# Patient Record
Sex: Female | Born: 1985 | Race: White | Hispanic: No | Marital: Married | Smoking: Current some day smoker
Health system: Southern US, Community
[De-identification: ages and names within clinical notes are randomized; demographics above are authoritative.]

## PROBLEM LIST (undated history)

## (undated) ENCOUNTER — Ambulatory Visit: Discharge status: Absent without leave | Payer: Self-pay

## (undated) DIAGNOSIS — K859 Acute pancreatitis without necrosis or infection, unspecified: Secondary | ICD-10-CM

## (undated) HISTORY — PX: PLACEMENT OF BREAST IMPLANTS: SHX6334

## (undated) HISTORY — DX: Acute pancreatitis without necrosis or infection, unspecified: K85.90

## (undated) HISTORY — PX: TUMOR REMOVAL: SHX12

---
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
## 2007-09-13 ENCOUNTER — Emergency Department (HOSPITAL_COMMUNITY): Admission: EM | Admit: 2007-09-13 | Discharge: 2007-09-14 | Payer: Self-pay | Admitting: Emergency Medicine

## 2007-11-06 ENCOUNTER — Encounter (INDEPENDENT_AMBULATORY_CARE_PROVIDER_SITE_OTHER): Payer: Self-pay | Admitting: Obstetrics and Gynecology

## 2007-11-06 ENCOUNTER — Inpatient Hospital Stay (HOSPITAL_COMMUNITY): Admission: AD | Admit: 2007-11-06 | Discharge: 2007-11-06 | Payer: Self-pay | Admitting: Obstetrics and Gynecology

## 2008-05-15 ENCOUNTER — Inpatient Hospital Stay (HOSPITAL_COMMUNITY): Admission: AD | Admit: 2008-05-15 | Discharge: 2008-05-15 | Payer: Self-pay | Admitting: Obstetrics and Gynecology

## 2008-08-26 ENCOUNTER — Emergency Department (HOSPITAL_COMMUNITY): Admission: EM | Admit: 2008-08-26 | Discharge: 2008-08-27 | Payer: Self-pay | Admitting: Emergency Medicine

## 2009-04-09 ENCOUNTER — Emergency Department: Payer: Self-pay | Admitting: Emergency Medicine

## 2009-05-10 ENCOUNTER — Inpatient Hospital Stay (HOSPITAL_COMMUNITY): Admission: AD | Admit: 2009-05-10 | Discharge: 2009-05-10 | Payer: Self-pay | Admitting: Obstetrics and Gynecology

## 2010-09-16 LAB — WET PREP, GENITAL
Clue Cells Wet Prep HPF POC: NONE SEEN
Yeast Wet Prep HPF POC: NONE SEEN

## 2010-09-16 LAB — ABO/RH: ABO/RH(D): O POS

## 2010-09-16 LAB — CBC
HCT: 32.9 % — ABNORMAL LOW (ref 36.0–46.0)
Platelets: 250 10*3/uL (ref 150–400)
WBC: 10.8 10*3/uL — ABNORMAL HIGH (ref 4.0–10.5)

## 2010-09-16 LAB — HCG, QUANTITATIVE, PREGNANCY: hCG, Beta Chain, Quant, S: 4118 m[IU]/mL — ABNORMAL HIGH (ref <5)

## 2010-10-27 NOTE — Discharge Summary (Signed)
NAMEORENE, ABBASI             ACCOUNT NO.:  [id number]   MEDICAL RECORD NO.:  [id number]          PATIENT TYPE:  INP   LOCATION:  9142                          FACILITY:  [facility]   PHYSICIAN:  Sherron Monday, MD        DATE OF BIRTH:  [birth year]   DATE OF ADMISSION:  11/25/2006  DATE OF DISCHARGE:  11/27/2006                               DISCHARGE SUMMARY   ADMITTING DIAGNOSES:  1. Intrauterine pregnancy at term.  2. Early labor.  3. High blood pressure.   DISCHARGE DIAGNOSES:  1. Intrauterine pregnancy at term.  2. Early labor.  3. High blood pressure.  4. Delivered via spontaneous vaginal delivery.   HPI:  A 25 year old G1 P1 with an EDC of November 25, 2006.  Was admitted on  the 13th with early labor and elevated blood pressures on the day she  was supposed to be admitted for induction of labor.  She has good fetal movement, no loss of fluid, no vaginal bleeding and  painful contractions.   PRENATAL LABS:  She is O positive, antibody screen negative and RPR  nonreactive, rubella equivocal.  Hepatitis B Surface Antigen negative,  HIV negative, gonorrhea and Chlamydia negative.  One hour Glucola of 83.  Group B Strep was negative, quad screen was negative.   An ultrasound performed on May 17, 2006, at approximately 12 weeks  and 4 days gave an [facility] of November 25, 2006.  A repeat ultrasound on July 04, 2006, at approximately 19 weeks revealed normal anatomy.  Prenatal  care was essentially uncomplicated except for some pre-term contractions  with a negative fetal fibronectin and recent borderline blood pressure  elevation.  On the day of admission she started contracting at midnight  and on admission denies any symptoms of preeclampsia such as headache,  blurry vision, scotomata or swelling.  Once she was admitted and  comfortable her blood pressures normalized, these were just monitored  closely.   PAST MEDICAL HISTORY:  Nonsignificant.   PAST SURGICAL HISTORY:   Significant for ear surgery in 2001.   PAST OB/GYN HISTORY:  1. She is a G1 with the present pregnancy.  2. No abnormal Pap smears or sexually transmitted diseases.   MEDICATIONS:  None.   ALLERGIES:  No known drug allergies.   SOCIAL HISTORY:  Denies alcohol, tobacco or drug use.   FAMILY HISTORY:  Significant for:  1. Father with high blood pressure.  2. Mother with thyroid disease.  3. Maternal grandmother with skin cancer.   On admission she was afebrile, vital signs stable.  An elevated blood  pressure, it was 142-146/100-106.  Shortly thereafter her blood pressure  was closely monitored and had noted to settle into the 140's/90's and  just had to be monitored closely.  An artificial rupture of membranes  was performed.  She was found to be 3-4 cm dilated, 90% effaced and 0  station, AROMs were clear fluid.  Her labor course was relatively  uncomplicated.  She progressed to complete, complete +2.  Pushed well to  deliver a viable female infant at 8 with the placenta  delivered at  1536.  The weight if the infant was 8 pounds, 7 ounces and Apgars were 8  and 9.  There was a right labial laceration, a secondary perineal  laceration which were repaired with #3-0 Vicryl.   EBL:  Less than 500 mL.   Following the delivery, the risks, benefits and alternatives of the  circumcision was discussed with the patient and father of the baby, they  wished to proceed.  Her postpartum course was uncomplicated, she was  afebrile, stable vital signs, normal lochia and her pain was well  controlled.  She was discharged to home on postpartum day #2 doing well.  She was discharged with prescriptions for Motrin, Vicodin and prenatal  vitamins as well as routine instructions and numbers to call with any  questions or problems.  She will follow up in approximately 6 weeks.  Her blood type is O positive.  She is rubella equivocal and received a  rubella immunization prior to discharge.  She would  like to discuss her  contraceptive options at 6 weeks.  Her hemoglobin is 10.2 and she plans  to breastfeed.      Sherron Monday, MD  Electronically Signed     JB/MEDQ  D:  11/27/2006  T:  11/27/2006  Job:  [id number]

## 2010-10-30 NOTE — Op Note (Signed)
[place]  Patient:    Donna Ryan, Donna Ryan                    MRN: [id number] Proc. Date: 10/09/99 Adm. Date:  [id number] Attending:  Serena Colonel H CC:         Linward Headland, M.D.                           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  PROCEDURE:  Left tympanoplasty/mastoidectomy with total ossicular replacement prosthesis.  SURGEON:  Jefry H. Pollyann Kennedy, M.D.  ANESTHESIA:  General endotracheal anesthesia.  COMPLICATIONS:  None.  ESTIMATED BLOOD LOSS:  40 cc.  REFERRING PHYSICIAN:  Linward Headland, M.D.  HISTORY:  A 25 year old girl with a multi-year history of chronic drainage from the left ear and significant conductive hearing loss.  Risks, benefits, alternatives and complications of the procedure were explained to the mother, who seemed to understand and agreed to surgery.  PROCEDURE:  The patient was taken to the operating room, placed on the operating table in the supine position.  Following induction of general endotracheal anesthesia, the left ear was prepped and draped in the standard fashion.  Xylocaine 1% with epinephrine was infiltrated into four quadrants of the ear canal and the postauricular sulcus.  Inspection of the ear canal was performed using the operating microscope and cerumen was cleaned from the canal.  A deep posterior retraction was identified.  I was not able to see the depth of it posteriorly.  There was retraction within the attic as well.  The malleus was identified, but the other ossicles were not visualized through the canal.  A vascular strip was created posteriorly and postauricular skin incision was created bringing the ear forward.  Loose areolar tissue graft  was harvested from the temporalis area, pressed and dried on the back table.  A tympanomeatal flap was brought anteriorly off the posterior canal wall. Cholesteatoma matrix was invading posteriorly beyond the facial recess area and attempts were made to remove all of this abnormal tissue.  I was not able to perform this safely and a mastoidectomy was then performed.  Mastoid cortex was removed with a large cutting bur.  The mastoid air cells were somewhat sclerotic and mal-developed and filled with inflamed mucosa, but no cholesteatoma.  The tegmen was identified and smoothed down with a diamond bur.  The sigmoid sinus plate was identified as well and also smoothed down with diamond bur and kept intact.  The sigmoid was riding somewhat anteriorly. The lateral semicircular canal was identified and also smoothed.  The antrum was entered. 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  The facial nerve external genu was identified and was intact. There was no dehiscence in this area.  There did not appear by visual inspection or palpation to be any dehiscent facial nerves in the vertical segment either.  The ossicles were identified as the tympanic membrane was brought forward visualizing through the mastoid and through the ear canal. The inferior most aspect of the incus long process was absent.  The stapes superstructure was absent.  The footplate was intact and there was cholesteatoma in the round and oval window areas that was easily cleaned out without disrupting the mucosa  covering the oval window or the round window. Cholesteatoma was teased out of the epitympanum and the antrum area.  The head of the malleus was resected with malleus nippers.  The remnant of the incus was removed as well.  All cholesteatoma was cleaned out surrounding these areas and sent for pathologic evaluation.  There was some extending down into the hypotympanum that was cleaned out as  well.  The only area that remained difficult to visualize by this root was the facial recess area.  An incision was then made to take down the posterior canal wall.  This was taken down to the vertical segment of the facial nerve, which was all intact.  Cholesteatoma was then cleaned out of the facial recess area adequately.  Once all cholesteatoma matrix was resected adequately and the entire cavity was polished with a polishing diamond bur, the anterior middle ear was packed with saline-soaked Gelfoam.  A graft was cut to size and shape and a notch was left for the malleus and it was then inserted medial to the anterior tympanic membrane remnant to reconstruct the tympanic membrane.  The posterior aspect was draped over the facial nerve.  The posterior tympanic cavity was packed with saline-soaked Gelfoam and a Torp prosthesis was cut down to proper size and was placed connecting the lateral process of the malleus down to the footplate.  There was good positioning.  There was good mobility of the prosthesis with the malleus.  The round window reflex was present.  The ear canal and cavity were packed with Cortisporin soaked Gelfoam.  A meatoplasty was performed with sharp dissection through the superior and inferior incisurae.  The conchal bowl skin was tacked posteriorly using a Vicryl suture.  Postauricular incision was reapproximated with subcutaneous Vicryl suture.  Benzoin and Steri-Strips were applied in the postauricular sulcus. The ear canal was packed with bacitracin-coated cotton ball and a mastoid dressing was applied.  Patient was then awakened, extubated and transferred to recovery in stable condition. DD:  10/09/99 TD:  10/10/99 Job: [id number]

## 2011-03-10 LAB — CBC
MCV: 91.6
RBC: 3.99
WBC: 6.4

## 2011-03-10 LAB — GC/CHLAMYDIA PROBE AMP, GENITAL
Chlamydia, DNA Probe: NEGATIVE
GC Probe Amp, Genital: NEGATIVE

## 2011-03-10 LAB — SAMPLE TO BLOOD BANK

## 2011-03-10 LAB — HCG, QUANTITATIVE, PREGNANCY: hCG, Beta Chain, Quant, S: 48 — ABNORMAL HIGH

## 2011-03-19 LAB — CBC
Platelets: 274 10*3/uL (ref 150–400)
RDW: 13.2 % (ref 11.5–15.5)
WBC: 6.8 10*3/uL (ref 4.0–10.5)

## 2011-03-19 LAB — WET PREP, GENITAL
Trich, Wet Prep: NONE SEEN
Yeast Wet Prep HPF POC: NONE SEEN

## 2011-03-19 LAB — GC/CHLAMYDIA PROBE AMP, GENITAL: Chlamydia, DNA Probe: NEGATIVE

## 2011-03-19 LAB — HCG, QUANTITATIVE, PREGNANCY: hCG, Beta Chain, Quant, S: 29880 m[IU]/mL — ABNORMAL HIGH (ref <5)

## 2011-04-01 LAB — CBC
HCT: 29.8 — ABNORMAL LOW
Hemoglobin: 10.2 — ABNORMAL LOW
Hemoglobin: 9.7 — ABNORMAL LOW
MCHC: 34.3
MCV: 91.3
Platelets: 300
RBC: 3.05 — ABNORMAL LOW
RBC: 3.12 — ABNORMAL LOW
RBC: 3.26 — ABNORMAL LOW
RDW: 14
WBC: 11.6 — ABNORMAL HIGH
WBC: 13.1 — ABNORMAL HIGH

## 2011-04-01 LAB — URINALYSIS, ROUTINE W REFLEX MICROSCOPIC
Bilirubin Urine: NEGATIVE
Glucose, UA: NEGATIVE
Glucose, UA: NEGATIVE
Hgb urine dipstick: NEGATIVE
Leukocytes, UA: NEGATIVE
Protein, ur: NEGATIVE
Specific Gravity, Urine: 1.025
Specific Gravity, Urine: >1.03 — ABNORMAL HIGH
Urobilinogen, UA: 0.2
pH: 5.5

## 2011-04-01 LAB — COMPREHENSIVE METABOLIC PANEL
ALT: 11
AST: 16
AST: 24
Alkaline Phosphatase: 179 — ABNORMAL HIGH
BUN: 6
CO2: 20
CO2: 24
Calcium: 9.4
Chloride: 109
Creatinine, Ser: 0.57
GFR calc Af Amer: >60
GFR calc Af Amer: >60
GFR calc non Af Amer: >60
Glucose, Bld: 86
Glucose, Bld: 96
Potassium: 4
Sodium: 136
Total Bilirubin: 0.6
Total Protein: 5.8 — ABNORMAL LOW

## 2011-04-01 LAB — URINE MICROSCOPIC-ADD ON: WBC, UA: NONE SEEN

## 2011-04-01 LAB — LACTATE DEHYDROGENASE
LDH: 167
LDH: 178

## 2014-09-17 ENCOUNTER — Emergency Department (HOSPITAL_COMMUNITY)
Admission: EM | Admit: 2014-09-17 | Discharge: 2014-09-17 | Disposition: A | Discharge status: Home / Self Care | Payer: 59 | Attending: Emergency Medicine | Admitting: Emergency Medicine

## 2014-09-17 ENCOUNTER — Encounter (HOSPITAL_COMMUNITY): Payer: Self-pay

## 2014-09-17 DIAGNOSIS — N644 Mastodynia: Secondary | ICD-10-CM | POA: Diagnosis present

## 2014-09-17 DIAGNOSIS — Z79899 Other long term (current) drug therapy: Secondary | ICD-10-CM | POA: Diagnosis not present

## 2014-09-17 DIAGNOSIS — Y831 Surgical operation with implant of artificial internal device as the cause of abnormal reaction of the patient, or of later complication, without mention of misadventure at the time of the procedure: Secondary | ICD-10-CM | POA: Diagnosis not present

## 2014-09-17 DIAGNOSIS — T8549XA Other mechanical complication of breast prosthesis and implant, initial encounter: Secondary | ICD-10-CM | POA: Diagnosis not present

## 2014-09-17 DIAGNOSIS — Z3202 Encounter for pregnancy test, result negative: Secondary | ICD-10-CM | POA: Diagnosis not present

## 2014-09-17 DIAGNOSIS — Z720 Tobacco use: Secondary | ICD-10-CM | POA: Diagnosis not present

## 2014-09-17 LAB — POC URINE PREG, ED
Preg Test, Ur: NEGATIVE
Preg Test, Ur: NEGATIVE

## 2014-09-17 NOTE — ED Provider Notes (Signed)
CSN: [id number]     Arrival date & time 09/17/14  1931 History   First MD Initiated Contact with Patient 09/17/14 2039     Chief Complaint  Patient presents with  . Breast Problem     (Consider location/radiation/quality/duration/timing/severity/associated sxs/prior Treatment) HPI   29 year old female presenting for evaluation of left breast pain. Patient is status post breast augmentation years ago. 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 She had acute onset of some pain behind her left breast. Describes his as pressure and numbness. She feels like her left implant is raised as compared to the right and has ripples along the lateral aspect of the left breast.  History reviewed. No pertinent past medical history. Past Surgical History  Procedure Laterality Date  . Placement of breast implants     History reviewed. No pertinent family history. History  Substance Use Topics  . Smoking status: Light Tobacco Smoker  . Smokeless tobacco: Not on file  . Alcohol Use: Yes   OB History    No data available     Review of Systems  All systems reviewed and negative, other than as noted in HPI.   Allergies  Review of patient's allergies indicates no known allergies.  Home Medications   Prior to Admission medications   Medication Sig Start Date End Date Taking? Authorizing Provider  amphetamine-dextroamphetamine (ADDERALL XR) 30 MG 24 hr capsule Take 30 mg by mouth daily.   Yes Historical Provider, MD   BP 123/84 mmHg  Pulse 60  Temp(Src) 98.2 F (36.8 C) (Oral)  Resp 12  Wt 125 lb 9.6 oz (56.972 kg)  SpO2 100%  LMP 09/17/2014 Physical Exam  Constitutional: She appears well-developed and well-nourished. No distress.  HENT:  Head: Normocephalic and atraumatic.  Eyes: Conjunctivae are normal. Right eye exhibits no discharge. Left eye exhibits no discharge.  Neck: Neck supple.  Cardiovascular: Normal rate, regular rhythm and normal heart  sounds.  Exam reveals no gallop and no friction rub.   No murmur heard. Pulmonary/Chest: Effort normal and breath sounds normal. No respiratory distress.  Left breast is of slightly raised appearance as compared to the right. There is some rippling of the skin along the lateral aspect of the left breast presumably from underlying implant.   Abdominal: Soft. She exhibits no distension. There is no tenderness.  Musculoskeletal: She exhibits no edema or tenderness.  Neurological: She is alert.  Skin: Skin is warm and dry.  Psychiatric: She has a normal mood and affect. Her behavior is normal. Thought content normal.  Nursing note and vitals reviewed.   ED Course  Procedures (including critical care time) Labs Review Labs Reviewed  POC URINE PREG, ED    Imaging Review No results found.   EKG Interpretation None      MDM   Final diagnoses:  Rippling of breast implant, initial encounter    29yF with L breast pain/deformity of L breast implant. Rupture versus movement. Will US. 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   Discussed with radiology. [facility]ending [facility] to [facility]. ED/inpatient studies done primarily for abscess. Radiology feels that both for technical and financial reasons she would be better served going there so they do so many more of them.     Raeford RazorStephen Rindi Beechy, MD 09/25/14 [phone]

## 2014-09-17 NOTE — Discharge Instructions (Signed)
Radiology feels that you would be better served in going to the [facility] for imaging. The ultrasound technicians there image breasts routinely. The technicians in the inpatient setting do not do them frequently and when they do it's typically looking for things such as abscess.

## 2014-09-17 NOTE — ED Notes (Signed)
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

## 2015-08-04 DIAGNOSIS — Z3009 Encounter for other general counseling and advice on contraception: Secondary | ICD-10-CM | POA: Diagnosis not present

## 2015-08-04 DIAGNOSIS — Z30013 Encounter for initial prescription of injectable contraceptive: Secondary | ICD-10-CM | POA: Diagnosis not present

## 2015-12-25 ENCOUNTER — Emergency Department (HOSPITAL_COMMUNITY): Payer: 59

## 2015-12-25 ENCOUNTER — Emergency Department (HOSPITAL_COMMUNITY)
Admission: EM | Admit: 2015-12-25 | Discharge: 2015-12-25 | Disposition: A | Discharge status: Home / Self Care | Payer: 59 | Attending: Emergency Medicine | Admitting: Emergency Medicine

## 2015-12-25 ENCOUNTER — Encounter (HOSPITAL_COMMUNITY): Payer: Self-pay | Admitting: Emergency Medicine

## 2015-12-25 DIAGNOSIS — Y999 Unspecified external cause status: Secondary | ICD-10-CM | POA: Diagnosis not present

## 2015-12-25 DIAGNOSIS — Y939 Activity, unspecified: Secondary | ICD-10-CM | POA: Diagnosis not present

## 2015-12-25 DIAGNOSIS — S39012A Strain of muscle, fascia and tendon of lower back, initial encounter: Secondary | ICD-10-CM | POA: Insufficient documentation

## 2015-12-25 DIAGNOSIS — S29019A Strain of muscle and tendon of unspecified wall of thorax, initial encounter: Secondary | ICD-10-CM

## 2015-12-25 DIAGNOSIS — R0789 Other chest pain: Secondary | ICD-10-CM | POA: Diagnosis not present

## 2015-12-25 DIAGNOSIS — Y92410 Unspecified street and highway as the place of occurrence of the external cause: Secondary | ICD-10-CM | POA: Insufficient documentation

## 2015-12-25 DIAGNOSIS — F17200 Nicotine dependence, unspecified, uncomplicated: Secondary | ICD-10-CM | POA: Diagnosis not present

## 2015-12-25 DIAGNOSIS — S3992XA Unspecified injury of lower back, initial encounter: Secondary | ICD-10-CM | POA: Diagnosis present

## 2015-12-25 DIAGNOSIS — S8002XA Contusion of left knee, initial encounter: Secondary | ICD-10-CM | POA: Diagnosis not present

## 2015-12-25 DIAGNOSIS — S29012A Strain of muscle and tendon of back wall of thorax, initial encounter: Secondary | ICD-10-CM | POA: Insufficient documentation

## 2015-12-25 MED ORDER — TRAMADOL HCL 50 MG PO TABS: 50.0000 mg | ORAL_TABLET | Freq: Four times a day (QID) | ORAL | Status: DC | PRN

## 2015-12-25 MED ORDER — IBUPROFEN 800 MG PO TABS: 800.0000 mg | ORAL_TABLET | Freq: Three times a day (TID) | ORAL | Status: DC | PRN
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
## 2015-12-25 NOTE — ED Notes (Signed)
Declined W/C at D/C and was escorted to lobby by RN. 

## 2015-12-25 NOTE — Discharge Instructions (Signed)
Return here as needed. Follow up with your primary doctor. Ice and heat on the areas that are sore.
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
## 2019-02-14 ENCOUNTER — Encounter (HOSPITAL_COMMUNITY): Payer: Self-pay | Admitting: Emergency Medicine

## 2019-02-14 ENCOUNTER — Emergency Department (HOSPITAL_COMMUNITY)
Admission: EM | Admit: 2019-02-14 | Discharge: 2019-02-14 | Discharge status: Against Medical Advice | Payer: 59 | Attending: Emergency Medicine | Admitting: Emergency Medicine

## 2019-02-14 DIAGNOSIS — F10920 Alcohol use, unspecified with intoxication, uncomplicated: Secondary | ICD-10-CM | POA: Insufficient documentation

## 2019-02-14 DIAGNOSIS — Z79899 Other long term (current) drug therapy: Secondary | ICD-10-CM | POA: Insufficient documentation

## 2019-02-14 DIAGNOSIS — F32A Depression, unspecified: Secondary | ICD-10-CM

## 2019-02-14 DIAGNOSIS — F329 Major depressive disorder, single episode, unspecified: Secondary | ICD-10-CM | POA: Insufficient documentation

## 2019-02-14 DIAGNOSIS — Z046 Encounter for general psychiatric examination, requested by authority: Secondary | ICD-10-CM | POA: Insufficient documentation

## 2019-02-14 DIAGNOSIS — F17200 Nicotine dependence, unspecified, uncomplicated: Secondary | ICD-10-CM | POA: Insufficient documentation

## 2019-02-14 DIAGNOSIS — R45851 Suicidal ideations: Secondary | ICD-10-CM | POA: Insufficient documentation

## 2019-02-14 DIAGNOSIS — Z5329 Procedure and treatment not carried out because of patient's decision for other reasons: Secondary | ICD-10-CM | POA: Insufficient documentation

## 2019-02-14 DIAGNOSIS — F419 Anxiety disorder, unspecified: Secondary | ICD-10-CM | POA: Insufficient documentation

## 2019-02-14 DIAGNOSIS — Y906 Blood alcohol level of 120-199 mg/100 ml: Secondary | ICD-10-CM | POA: Insufficient documentation

## 2019-02-14 LAB — COMPREHENSIVE METABOLIC PANEL
ALT: 14 U/L (ref 0–44)
AST: 16 U/L (ref 15–41)
Albumin: 4.5 g/dL (ref 3.5–5.0)
Alkaline Phosphatase: 75 U/L (ref 38–126)
Anion gap: 9 (ref 5–15)
BUN: 14 mg/dL (ref 6–20)
CO2: 23 mmol/L (ref 22–32)
Calcium: 9 mg/dL (ref 8.9–10.3)
Chloride: 108 mmol/L (ref 98–111)
Creatinine, Ser: 0.69 mg/dL (ref 0.44–1.00)
GFR calc Af Amer: >60 mL/min (ref >60)
GFR calc non Af Amer: >60 mL/min (ref >60)
Glucose, Bld: 101 mg/dL — ABNORMAL HIGH (ref 70–99)
Potassium: 3.3 mmol/L — ABNORMAL LOW (ref 3.5–5.1)
Sodium: 140 mmol/L (ref 135–145)
Total Bilirubin: 0.3 mg/dL (ref 0.3–1.2)
Total Protein: 7.8 g/dL (ref 6.5–8.1)

## 2019-02-14 LAB — RAPID URINE DRUG SCREEN, HOSP PERFORMED
Amphetamines: NOT DETECTED
Barbiturates: NOT DETECTED
Benzodiazepines: NOT DETECTED
Cocaine: NOT DETECTED
Opiates: NOT DETECTED
Tetrahydrocannabinol: NOT DETECTED

## 2019-02-14 LAB — SALICYLATE LEVEL: Salicylate Lvl: <7 mg/dL (ref 2.8–30.0)

## 2019-02-14 LAB — I-STAT BETA HCG BLOOD, ED (MC, WL, AP ONLY): I-stat hCG, quantitative: <5 m[IU]/mL (ref <5)

## 2019-02-14 LAB — CBC
HCT: 41.8 % (ref 36.0–46.0)
Hemoglobin: 14 g/dL (ref 12.0–15.0)
MCH: 32.2 pg (ref 26.0–34.0)
MCHC: 33.5 g/dL (ref 30.0–36.0)
MCV: 96.1 fL (ref 80.0–100.0)
Platelets: 287 10*3/uL (ref 150–400)
RBC: 4.35 MIL/uL (ref 3.87–5.11)
RDW: 11.9 % (ref 11.5–15.5)
WBC: 6.5 10*3/uL (ref 4.0–10.5)
nRBC: 0 % (ref 0.0–0.2)

## 2019-02-14 LAB — ACETAMINOPHEN LEVEL: Acetaminophen (Tylenol), Serum: <10 ug/mL — ABNORMAL LOW (ref 10–30)

## 2019-02-14 LAB — ETHANOL: Alcohol, Ethyl (B): 185 mg/dL — ABNORMAL HIGH (ref <10)

## 2019-02-14 NOTE — ED Provider Notes (Signed)
[facility] DEPT Provider Note   CSN: [id number] Arrival date & time: 02/14/19  0014     History   Chief Complaint No chief complaint on file.   HPI Donna Ryan is a 33 y.o. female.     33 year old female presents to the emergency department for suicidal ideations.  Patient was experiencing suicidal ideations earlier in the evening, without plan.  Is now stating that she just "wants to die".  She states that "sometimes life just sucks", but will not allude to any specific stressors contributing to her worsening depression.  She has never been seen in the past by a therapist or psychiatrist.  No history of behavioral health hospitalization.  She does own/have access to a firearm, but states that she would "never use it for that".  Had 2 glasses of wine tonight.  No reported illicit drug use.  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  The history is provided by the patient. No language interpreter was used.    History reviewed. No pertinent past medical history.  There are no active problems to display for this patient.   Past Surgical History:  Procedure Laterality Date  . PLACEMENT OF BREAST IMPLANTS       OB History   No obstetric history on file.      Home Medications    Prior to Admission medications   Medication Sig Start Date End Date Taking? Authorizing Provider  amphetamine-dextroamphetamine (ADDERALL XR) 30 MG 24 hr capsule Take 30 mg by mouth daily.    [provider]  ibuprofen (ADVIL,MOTRIN) 800 MG tablet Take 1 tablet (800 mg total) by mouth every 8 (eight) hours as needed. 12/25/15   Lawyer, Harrell Gave, PA-C  traMADol (ULTRAM) 50 MG tablet Take 1 tablet (50 mg total) by mouth every 6 (six) hours as needed for severe pain. 12/25/15   Dalia Heading, PA-C    Family History No family history on file.  Social History Social History   Tobacco Use  . Smoking status: Light Tobacco Smoker  . Smokeless tobacco:  Never Used  Substance Use Topics  . Alcohol use: Yes  . Drug use: No     Allergies   Patient has no known allergies.   Review of Systems Review of Systems Ten systems reviewed and are negative for acute change, except as noted in the HPI.    Physical Exam Updated Vital Signs BP (!) 165/148 (BP Location: Right Arm)   Pulse (!) 123 Comment: pt crying while vitals being obtained  Temp 98.8 F (37.1 C) (Oral)   Resp (!) 22   SpO2 100%   Physical Exam Vitals signs and nursing note reviewed.  Constitutional:      General: She is not in acute distress.    Appearance: She is well-developed. She is not diaphoretic.     Comments: Patient tearful, anxious.  HENT:     Head: Normocephalic and atraumatic.  Eyes:     General: No scleral icterus.    Conjunctiva/sclera: Conjunctivae normal.  Neck:     Musculoskeletal: Normal range of motion.  Pulmonary:     Effort: Pulmonary effort is normal. No respiratory distress.     Comments: Respirations even and unlabored Musculoskeletal: Normal range of motion.  Skin:    General: Skin is warm and dry.     Coloration: Skin is not pale.     Findings: No erythema or rash.  Neurological:     Mental Status: She is alert and oriented  to person, place, and time.     Coordination: Coordination normal.  Psychiatric:        Mood and Affect: Mood is anxious and depressed. Affect is tearful.        Speech: Speech normal.        Behavior: Behavior is cooperative.        Thought Content: Thought content does not include homicidal or suicidal ideation. Thought content does not include homicidal plan.     Comments: States she was having SI earlier, but not currently. Now is feeling that she "just doesn't want to be alive".      ED Treatments / Results  Labs (all labs ordered are listed, but only abnormal results are displayed) Labs Reviewed  COMPREHENSIVE METABOLIC PANEL - Abnormal; Notable for the following components:      Result Value    Potassium 3.3 (*)    Glucose, Bld 101 (*)    All other components within normal limits  ETHANOL - Abnormal; Notable for the following components:   Alcohol, Ethyl (B) 185 (*)    All other components within normal limits  ACETAMINOPHEN LEVEL - Abnormal; Notable for the following components:   Acetaminophen (Tylenol), Serum <10 (*)    All other components within normal limits  SALICYLATE LEVEL  CBC  RAPID URINE DRUG SCREEN, HOSP PERFORMED  I-STAT BETA HCG BLOOD, ED ([facility], [facility], [facility] ONLY)    EKG None  Radiology No results found.  Procedures Procedures (including critical care time)  Medications Ordered in ED Medications - No data to display   Initial Impression / Assessment and Plan / ED Course  I have reviewed the triage vital signs and the nursing notes.  Pertinent labs & imaging results that were available during my care of the patient were reviewed by me and considered in my medical decision making (see chart for details).        33 year old female presents to the emergency department for worsening depression and suicidal thoughts.  She states that she was experiencing SI prior to arrival, but currently denies SI or plan.  She does own/have access to a firearm, but reports that she would never use it to kill herself.  She has no history of behavioral health hospitalization.  The patient has been medically cleared and is pending TTS assessment for recommendations.  Disposition to be determined by oncoming ED provider.   Final Clinical Impressions(s) / ED Diagnoses   Final diagnoses:  Depression, unspecified depression type  Alcoholic intoxication without complication [facility](HCC)    ED Discharge Orders    None       Antony MaduraHumes, Jasani Dolney, PA-C 02/14/19 0537    Dione BoozeGlick, David, MD 02/14/19 [phone]

## 2019-02-14 NOTE — ED Triage Notes (Signed)
Patient here from home with boyfriend with complaints of SI stating "I just want to die". "I hate my life". Patient does not elaborate.

## 2019-02-14 NOTE — ED Notes (Signed)
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 Wants to do outpatient. MD notified.

## 2019-02-14 NOTE — BHH Counselor (Addendum)
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  Spoke with Pt's mother Teressa Lower at [phone] who said that she believed Pt was safe to be discharged.  Consulted with Peri Maris, DO.  Pt may leave AMA.

## 2019-02-14 NOTE — ED Notes (Signed)
Sent page via [place] to [facility] on call provider regarding patient's request to d/c to community with outpatient referral.

## 2019-02-14 NOTE — ED Notes (Signed)
Pt provided with clothes and personal belongings, she has resources and willing to sign AMA. Mother is with pt as they leave the department.

## 2019-02-14 NOTE — BH Assessment (Signed)
Tele Assessment Note   Patient Name: Donna Ryan MRN: [id number] Referring Physician: Antonietta Breach, PA-C Location of Patient: [facility] Location of Provider: [place] is a 33 y.o. female who presented to [facility] on voluntary basis (accompanied by boyfriend, who was not present at assessment) with complaint of suicidal ideation and other symptoms.  Pt lives in [place] with her boyfriend and her preteen son.  Pt owns and operates two businesses.  She is not followed by any outpatient psych or therapy provider.  Pt presented with complaint of suicidal ideation -- ''I just want to die... I prefer not to be here (alive)... I wish I were dead.''  Pt denied any plan or intent, and she denied any past suicide attempts.  Pt noted several stressors/triggers -- recent conflict with boyfriend (who may have been unfaithful), difficulty being a single mother raising a son, stress of managing two businesses, conflict with friends.  Pt reported that she has started drinking wine every night, and BAC on admission was 185.  Pt endorsed despondency that has lasted about a year, insomnia, poor appetite, tearfulness, feelings of hopelessness and worthlessness, isolation, fatigue, loss of pleasure.  Pt denied homicidal ideation, hallucination, and self-injurious behavior.  Pt reported having access to firearms, but she told hospital staff that she would never shoot herself with it.  Pt appears to have several criminal charges pending, including misdemeanor assault with a deadly weapon, communicating threats, and assault with a deadly weapon in front of a minor.    When asked what help Pt desired today, Pt stated that she was not sure.  When asked if she would harm herself if discharged, she stated, ''I don't know.'' Pt said she is open to inpatient care if she can find someone to operate her businesses.  During assessment, Pt presented as alert and oriented.  She had good eye contact  and was cooperative.  Pt was dressed in scrubs, and she appeared appropriately groomed.  Pt's demeanor was calm.  Pt's mood was depressed.  Affect was depressed and tearful.  Pt's speech was normal in rate, rhythm, and volume.  Thought processes were within normal range, and thought content was logical and goal-oriented.  There was no evidence of delusion.  Pt's memory and concentration were intact.  Insight, judgment, and impulse control were fair.  Consulted with Berneta Levins, NP, who determined that Pt meets inpatient criteria.  Diagnosis: Major Depressive Disorder, Single Ep., Severe w/o psychotic features  Past Medical History: History reviewed. No pertinent past medical history.  Past Surgical History:  Procedure Laterality Date  . PLACEMENT OF BREAST IMPLANTS      Family History: No family history on file.  Social History:  reports that she has been smoking. She has never used smokeless tobacco. She reports current alcohol use of about 2.0 - 3.0 standard drinks of alcohol per week. She reports that she does not use drugs.  Additional Social History:  Alcohol / Drug Use Pain Medications: See MAR Prescriptions: See MAR Over the Counter: See MAR History of alcohol / drug use?: Yes Substance #1 Name of Substance 1: Alcohol 1 - Amount (size/oz): Varied 1 - Frequency: Daily 1 - Duration: Ongoing 1 - Last Use / Amount: 02/13/2019 -- 2-3 glasses of wine; 185 BAC  CIWA: CIWA-Ar BP: 100/67 Pulse Rate: 70 COWS:    Allergies: No Known Allergies  Home Medications: (Not in a hospital admission)   OB/GYN Status:  No LMP recorded. Patient has  had an injection.  General Assessment Data Location of Assessment: [facility] TTS Assessment: In system Is this a Tele or Face-to-Face Assessment?: Tele Assessment Is this an Initial Assessment or a Re-assessment for this encounter?: Initial Assessment Patient Accompanied by:: N/A Language Other than English: No Living Arrangements: Other  (Comment) What gender do you identify as?: Female Marital status: Long term relationship Maiden name: Hurtado Pregnancy Status: No Living Arrangements: Spouse/significant other, Children(Boyfriend and preteen son) Can pt return to current living arrangement?: Yes Admission Status: Voluntary Is patient capable of signing voluntary admission?: Yes Referral Source: Self/Family/Friend Insurance type: None     Crisis Care Plan Living Arrangements: Spouse/significant other, Children(Boyfriend and preteen son) Name of Psychiatrist: None Name of Therapist: None  Education Status Is patient currently in school?: No Is the patient employed, unemployed or receiving disability?: Employed(Owns two businesses)  Risk to self with the past 6 months Suicidal Ideation: Yes-Currently Present Has patient been a risk to self within the past 6 months prior to admission? : No Suicidal Intent: No Has patient had any suicidal intent within the past 6 months prior to admission? : No Is patient at risk for suicide?: Yes(See notes) Suicidal Plan?: No Has patient had any suicidal plan within the past 6 months prior to admission? : No Access to Means: Yes Specify Access to Suicidal Means: Pt acknowledged access to firearms What has been your use of drugs/alcohol within the last 12 months?: Wine Previous Attempts/Gestures: No Intentional Self Injurious Behavior: None Family Suicide History: Unknown Recent stressful life event(s): Financial Problems Persecutory voices/beliefs?: No Depression: Yes Depression Symptoms: Despondent, Insomnia, Tearfulness, Isolating, Fatigue, Loss of interest in usual pleasures, Feeling worthless/self pity Substance abuse history and/or treatment for substance abuse?: No Suicide prevention information given to non-admitted patients: Not applicable  Risk to Others within the past 6 months Homicidal Ideation: No Does patient have any lifetime risk of violence toward others  beyond the six months prior to admission? : No Thoughts of Harm to Others: No Current Homicidal Intent: No Current Homicidal Plan: No Access to Homicidal Means: No History of harm to others?: No Assessment of Violence: None Noted Does patient have access to weapons?: No Criminal Charges Pending?: Yes Describe Pending Criminal Charges: Mis. assault by pointing a gun; AWDW minor present(Mis communicating threats; Mis injury to personal property) Does patient have a court date: No Is patient on probation?: No  Psychosis Hallucinations: None noted Delusions: None noted  Mental Status Report Appearance/Hygiene: Unremarkable, In scrubs Eye Contact: Good Motor Activity: Freedom of movement, Unremarkable Speech: Logical/coherent Level of Consciousness: Alert, Crying Mood: Depressed Affect: Depressed Anxiety Level: None Thought Processes: Coherent, Relevant Judgement: Partial Orientation: Person, Place, Time, Situation Obsessive Compulsive Thoughts/Behaviors: None  Cognitive Functioning Concentration: Normal Memory: Recent Intact, Remote Intact Is patient IDD: No Insight: Fair Impulse Control: Fair Appetite: Poor Have you had any weight changes? : Loss Amount of the weight change? (lbs): (Not sure) Sleep: Decreased Total Hours of Sleep: (Not sure) Vegetative Symptoms: None  ADLScreening [facility] Assessment Services) Patient's cognitive ability adequate to safely complete daily activities?: Yes Patient able to express need for assistance with ADLs?: Yes Independently performs ADLs?: Yes (appropriate for developmental age)  Prior Inpatient Therapy Prior Inpatient Therapy: No  Prior Outpatient Therapy Prior Outpatient Therapy: No Does patient have an ACCT team?: No Does patient have Intensive In-House Services?  : No Does patient have [facility] services? : No Does patient have P4CC services?: No  ADL Screening (condition at time of admission) Patient's  cognitive ability  adequate to safely complete daily activities?: Yes Is the patient deaf or have difficulty hearing?: No Does the patient have difficulty seeing, even when wearing glasses/contacts?: No Does the patient have difficulty concentrating, remembering, or making decisions?: No Patient able to express need for assistance with ADLs?: Yes Does the patient have difficulty dressing or bathing?: No Independently performs ADLs?: Yes (appropriate for developmental age) Does the patient have difficulty walking or climbing stairs?: No Weakness of Legs: None Weakness of Arms/Hands: None  Home Assistive Devices/Equipment Home Assistive Devices/Equipment: None  Therapy Consults (therapy consults require a physician order) PT Evaluation Needed: No OT Evalulation Needed: No SLP Evaluation Needed: No Abuse/Neglect Assessment (Assessment to be complete while patient is alone) Abuse/Neglect Assessment Can Be Completed: Yes Physical Abuse: Denies Verbal Abuse: Denies Sexual Abuse: Denies Exploitation of patient/patient's resources: Denies Self-Neglect: Denies Values / Beliefs Cultural Requests During Hospitalization: None Spiritual Requests During Hospitalization: None Consults Spiritual Care Consult Needed: No Social Work Consult Needed: No Merchant navy officer (For Healthcare) Does Patient Have a Medical Advance Directive?: No          Disposition:  Disposition Initial Assessment Completed for this Encounter: Yes Disposition of Patient: Admit Type of inpatient treatment program: Adult(Per T. Starkes, NP, Pt meets inpt criteria.)  This service was provided via telemedicine using a 2-way, interactive audio and video technology.  Names of all persons participating in this telemedicine service and their role in this encounter. Name: Maram Zendejas. Kai Role: Pt  Name: T. Starkes Role: NP  Name: E Ellee Wawrzyniak Role: TTS       Earline Mayotte 02/14/2019 9:20 AM

## 2019-02-14 NOTE — ED Notes (Signed)
Pt is requesting discharge home. Donna Ryan and Dr Mariea Clonts have discussed her case, a note has been made that she can sign AMA. They have spoken with her mother and mother feels she is ok for discharge. Pt is aware she will have to sign AMA. Resources for outpatient assistance have been provided. Pt has spent time with mother talking about AMA.

## 2019-02-14 NOTE — ED Notes (Signed)
Patient crying hysterically reporting that "hates life" and the situation that she is in.

## 2020-09-11 ENCOUNTER — Encounter: Payer: Self-pay | Admitting: Family Medicine

## 2020-09-11 ENCOUNTER — Other Ambulatory Visit: Payer: Self-pay

## 2020-09-11 ENCOUNTER — Ambulatory Visit (LOCAL_COMMUNITY_HEALTH_CENTER): Payer: Self-pay | Admitting: Family Medicine

## 2020-09-11 VITALS — BP 126/84 | Ht 64.0 in | Wt 153.6 lb

## 2020-09-11 DIAGNOSIS — B9689 Other specified bacterial agents as the cause of diseases classified elsewhere: Secondary | ICD-10-CM

## 2020-09-11 DIAGNOSIS — N760 Acute vaginitis: Secondary | ICD-10-CM

## 2020-09-11 DIAGNOSIS — Z30013 Encounter for initial prescription of injectable contraceptive: Secondary | ICD-10-CM

## 2020-09-11 DIAGNOSIS — Z0000 Encounter for general adult medical examination without abnormal findings: Secondary | ICD-10-CM

## 2020-09-11 DIAGNOSIS — Z3009 Encounter for other general counseling and advice on contraception: Secondary | ICD-10-CM

## 2020-09-11 LAB — WET PREP FOR TRICH, YEAST, CLUE
Trichomonas Exam: NEGATIVE
Yeast Exam: NEGATIVE

## 2020-09-11 LAB — PREGNANCY, URINE: Preg Test, Ur: NEGATIVE

## 2020-09-11 MED ORDER — METRONIDAZOLE 500 MG PO TABS: 500.0000 mg | ORAL_TABLET | Freq: Two times a day (BID) | ORAL | 0 refills | Status: AC

## 2020-09-11 MED ORDER — MEDROXYPROGESTERONE ACETATE 150 MG/ML IM SUSP
150.0000 mg | INTRAMUSCULAR | Status: AC
  Administered 2020-09-11: 150 mg via INTRAMUSCULAR

## 2020-09-11 NOTE — Progress Notes (Signed)
[facility] [address] Main Number: [phone]    Family Planning Visit- Initial Visit  Subjective:  Donna Ryan is a 35 y.o.  No obstetric history on file.   being seen today for an initial annual visit and to discuss contraceptive options.  The patient is currently using None for pregnancy prevention. Patient reports she does not want a pregnancy in the next year.  Patient has the following medical conditions does not have a problem list on file.  Chief Complaint  Patient presents with  . Annual Exam  . Contraception    Depo    Patient reports here for physical and to start depo.    Patient denies any questions or concerns.    Body mass index is 26.37 kg/m. - Patient is eligible for diabetes screening based on BMI and age >58?  not applicable HA1C ordered? not applicable  Patient reports 2  partner/s in last year. Desires STI screening?  Yes  Has patient been screened once for HCV in the past?  No  No results found for: HCVAB  Does the patient have current drug use (including MJ), have a partner with drug use, and/or has been incarcerated since last result? No  If yes-- Screen for HCV through [facility]   Does the patient meet criteria for HBV testing? No  Criteria:  -Household, sexual or needle sharing contact with HBV -History of drug use -HIV positive -Those with known Hep C   Health Maintenance Due  Topic Date Due  . Hepatitis C Screening  Never done  . COVID-19 Vaccine (1) Never done  . HIV Screening  Never done  . TETANUS/TDAP  Never done  . PAP SMEAR-Modifier  Never done  . INFLUENZA VACCINE  01/13/2020    Review of Systems  Constitutional: Negative for chills, fever, malaise/fatigue and weight loss.  HENT: Negative for congestion, hearing loss and sore throat.   Eyes: Negative for blurred vision, double vision and photophobia.  Respiratory: Negative for shortness of breath.    Cardiovascular: Negative for chest pain.  Gastrointestinal: Negative for abdominal pain, blood in stool, constipation, diarrhea, heartburn, nausea and vomiting.  Genitourinary: Negative for dysuria and frequency.  Musculoskeletal: Negative for back pain, joint pain and neck pain.  Skin: Negative for itching and rash.  Neurological: Negative for dizziness, weakness and headaches.  Endo/Heme/Allergies: Does not bruise/bleed easily.  Psychiatric/Behavioral: Negative for depression, substance abuse and suicidal ideas.    The following portions of the patient's history were reviewed and updated as appropriate: allergies, current medications, past family history, past medical history, past social history, past surgical history and problem list. Problem list updated.   See flowsheet for other program required questions.  Objective:   Vitals:   09/11/20 1326  BP: 126/84  Weight: 153 lb 9.6 oz (69.7 kg)  Height: 5\' 4"  (1.626 m)    Physical Exam Vitals and nursing note reviewed.  Constitutional:      Appearance: Normal appearance.  HENT:     Head: Normocephalic and atraumatic.     Mouth/Throat:     Mouth: Mucous membranes are moist.     Dentition: Normal dentition. No dental caries.     Pharynx: No oropharyngeal exudate or posterior oropharyngeal erythema.  Eyes:     General: No scleral icterus. Cardiovascular:     Rate and Rhythm: Normal rate.     Pulses: Normal pulses.  Pulmonary:     Effort: Pulmonary effort is  normal.  Chest:  Breasts:     Right: Normal.     Left: Normal.      Comments: Breasts:        Right: Normal. No swelling, mass, nipple discharge, skin change or tenderness.  Implant felt        Left: Normal. No swelling, mass, nipple discharge, skin change or tenderness. Implant felt  Abdominal:     General: Abdomen is flat. Bowel sounds are normal.     Palpations: Abdomen is soft.  Genitourinary:    General: Normal vulva.     Rectum: Normal.     Comments:  External genitalia without, lice, nits, erythema, edema , lesions or inguinal adenopathy. Vagina with normal mucosa and discharge and pH > 4.  Cervix without visual lesions, uterus firm, mobile, non-tender, no masses, CMT adnexal fullness or tenderness. Odor present.    Musculoskeletal:        General: Normal range of motion.     Cervical back: Normal range of motion and neck supple.  Skin:    General: Skin is warm and dry.  Neurological:     General: No focal deficit present.     Mental Status: She is alert and oriented to person, place, and time.  Psychiatric:        Mood and Affect: Mood normal.        Behavior: Behavior normal.       Assessment and Plan:  Donna Ryan is a 35 y.o. female presenting to the [facility] Department for an initial annual wellness/contraceptive visit   1. Family planning counseling  Contraception counseling: Reviewed all forms of birth control options in the tiered based approach. available including abstinence; over the counter/barrier methods; hormonal contraceptive medication including pill, patch, ring, injection,contraceptive implant, ECP; hormonal and nonhormonal IUDs; permanent sterilization options including vasectomy and the various tubal sterilization modalities. Risks, benefits, and typical effectiveness rates were reviewed.  Questions were answered.  Written information was also given to the patient to review.  Patient desires Depo, this was prescribed for patient. She will follow up in 3 months  for surveillance.  She was told to call with any further questions, or with any concerns about this method of contraception.  Emphasized use of condoms 100% of the time for STI prevention.  Patient was not offered ECP based on patient declined.   2. Routine general medical examination at a health care facility 3. Encounter for initial prescription of injectable contraceptive Patient here for PE and to start Depo.  Patient reports having 2  menses 1 one month.  PT negative today.  Pap not due until 07/2023.  CBE done today.    Pregnancy, urine - WET PREP FOR TRICH, YEAST, CLUE - Chlamydia/Gonorrhea [facility] - medroxyPROGESTERone (DEPO-PROVERA) injection 150 mg  4. BV (bacterial vaginosis) Wet prep has + amine & clue.  Patient treated for BV .  - metroNIDAZOLE (FLAGYL) 500 MG tablet; Take 1 tablet (500 mg total) by mouth 2 (two) times daily for 7 days.  Dispense: 14 tablet; Refill: 0   Return in about 11 weeks (around 11/27/2020) for Depo.  No future appointments.  Wendi Snipes, FNP

## 2020-09-11 NOTE — Progress Notes (Signed)
PT negative. Wet mount reviewed, and patient treated for BV per SO. Depo consent signed and Depo given, right glute, tolerated well and next Depo card given.Burt Knack, RN

## 2020-09-11 NOTE — Progress Notes (Signed)
Patient here for PE and wants to start Depo. Has taken Depo before, but not for several years. Currently using no BCM.  Last Pap and PE were 07/25/2018, and Pap was NIL and HPV negative.Burt Knack, RN

## 2020-11-24 ENCOUNTER — Emergency Department (HOSPITAL_BASED_OUTPATIENT_CLINIC_OR_DEPARTMENT_OTHER): Payer: Self-pay | Admitting: Radiology

## 2020-11-24 ENCOUNTER — Other Ambulatory Visit: Payer: Self-pay

## 2020-11-24 ENCOUNTER — Emergency Department (HOSPITAL_BASED_OUTPATIENT_CLINIC_OR_DEPARTMENT_OTHER)
Admission: EM | Admit: 2020-11-24 | Discharge: 2020-11-24 | Disposition: A | Discharge status: Home / Self Care | Payer: Self-pay | Attending: Emergency Medicine | Admitting: Emergency Medicine

## 2020-11-24 ENCOUNTER — Encounter (HOSPITAL_BASED_OUTPATIENT_CLINIC_OR_DEPARTMENT_OTHER): Payer: Self-pay | Admitting: *Deleted

## 2020-11-24 DIAGNOSIS — R1013 Epigastric pain: Secondary | ICD-10-CM | POA: Insufficient documentation

## 2020-11-24 DIAGNOSIS — K224 Dyskinesia of esophagus: Secondary | ICD-10-CM | POA: Insufficient documentation

## 2020-11-24 DIAGNOSIS — R197 Diarrhea, unspecified: Secondary | ICD-10-CM | POA: Insufficient documentation

## 2020-11-24 LAB — BASIC METABOLIC PANEL
Anion gap: 10 (ref 5–15)
BUN: 15 mg/dL (ref 6–20)
CO2: 26 mmol/L (ref 22–32)
Calcium: 9.6 mg/dL (ref 8.9–10.3)
Chloride: 101 mmol/L (ref 98–111)
Creatinine, Ser: 0.83 mg/dL (ref 0.44–1.00)
GFR, Estimated: >60 mL/min (ref >60)
Glucose, Bld: 96 mg/dL (ref 70–99)
Potassium: 3.6 mmol/L (ref 3.5–5.1)
Sodium: 137 mmol/L (ref 135–145)

## 2020-11-24 LAB — CBC
HCT: 44.8 % (ref 36.0–46.0)
Hemoglobin: 15.2 g/dL — ABNORMAL HIGH (ref 12.0–15.0)
MCH: 32.3 pg (ref 26.0–34.0)
MCHC: 33.9 g/dL (ref 30.0–36.0)
MCV: 95.1 fL (ref 80.0–100.0)
Platelets: 273 10*3/uL (ref 150–400)
RBC: 4.71 MIL/uL (ref 3.87–5.11)
RDW: 12 % (ref 11.5–15.5)
WBC: 3.9 10*3/uL — ABNORMAL LOW (ref 4.0–10.5)
nRBC: 0 % (ref 0.0–0.2)

## 2020-11-24 LAB — TROPONIN I (HIGH SENSITIVITY): Troponin I (High Sensitivity): <2 ng/L (ref <18)

## 2020-11-24 LAB — PREGNANCY, URINE: Preg Test, Ur: NEGATIVE

## 2020-11-24 MED ORDER — ONDANSETRON 4 MG PO TBDP: 4.0000 mg | ORAL_TABLET | ORAL | 0 refills | Status: DC | PRN

## 2020-11-24 MED ORDER — FAMOTIDINE 40 MG PO TABS: 40.0000 mg | ORAL_TABLET | Freq: Two times a day (BID) | ORAL | 0 refills | Status: DC

## 2020-11-24 MED ORDER — FAMOTIDINE 20 MG PO TABS
40.0000 mg | ORAL_TABLET | Freq: Once | ORAL | Status: AC
  Administered 2020-11-24: 40 mg via ORAL
  Filled 2020-11-24: qty 2

## 2020-11-24 NOTE — ED Triage Notes (Signed)
Midsternum chest pain about 11 am.  Then to throat, and rt arm as well as pain in mid back. Sent from [facility]. Reports she had "food poisoning yesterday" with vomiting.

## 2020-11-24 NOTE — ED Provider Notes (Signed)
[facility] EMERGENCY DEPT Provider Note   CSN: [id number] Arrival date & time: 11/24/20  1432     History Chief Complaint  Patient presents with   Chest Pain    Donna Ryan is a 35 y.o. female.  HPI Patient reports that she vomited all night until early hours of morning.  She had eaten a sausage biscuit at [facility] yesterday afternoon.  When she ate it, she did not feel well and then about 2 hours later she started vomiting and vomited all night.  It had stopped by early hours the morning.  She reports she was feeling better then this morning she suddenly got a severe pain to the center of her chest.  She reports it felt like she had a fist in the middle of her chest and then pain radiated up towards the base of her throat and out towards her shoulders and through to her back.  She did not take anything to try to relieve the pain.  It did start to abate and she was able to rest for a while.  She got back up and seem to be better but then the whole thing happened a second time.  Again, the pain has resolved.  She has not gone on to have any further vomiting.  She had a small amount of diarrhea.  No syncope.    History reviewed. No pertinent past medical history.  There are no problems to display for this patient.   Past Surgical History:  Procedure Laterality Date   PLACEMENT OF BREAST IMPLANTS     TUMOR REMOVAL       OB History     Gravida  1   Para  1   Term  1   Preterm      AB      Living  1      SAB      IAB      Ectopic      Multiple      Live Births              No family history on file.  Social History   Tobacco Use   Smoking status: Never   Smokeless tobacco: Never  Substance Use Topics   Alcohol use: Yes    Alcohol/week: 2.0 - 3.0 standard drinks    Types: 2 - 3 Glasses of wine per week    Comment: occasional   Drug use: No    Home Medications Prior to Admission medications   Medication Sig Start Date End Date  Taking? Authorizing Provider  famotidine (PEPCID) 40 MG tablet Take 1 tablet (40 mg total) by mouth 2 (two) times daily. 11/24/20  Yes Arby Barrette, MD  ondansetron (ZOFRAN ODT) 4 MG disintegrating tablet Take 1 tablet (4 mg total) by mouth every 4 (four) hours as needed for nausea or vomiting. 11/24/20  Yes Arby Barrette, MD  ibuprofen (ADVIL,MOTRIN) 800 MG tablet Take 1 tablet (800 mg total) by mouth every 8 (eight) hours as needed. Patient not taking: No sig reported 12/25/15   Lawyer, Cristal Deer, PA-C  traMADol (ULTRAM) 50 MG tablet Take 1 tablet (50 mg total) by mouth every 6 (six) hours as needed for severe pain. Patient not taking: No sig reported 12/25/15   Charlestine Night, PA-C    Allergies    Patient has no known allergies.  Review of Systems   Review of Systems 10 systems reviewed and negative except as per HPI Physical Exam Updated Vital  Signs BP 125/87 (BP Location: Right Arm)   Pulse (!) 59   Temp 98 F (36.7 C) (Oral)   Resp 16   Ht 5\' 2"  (1.575 m)   Wt 67.1 kg   LMP 11/07/2020 (Approximate)   SpO2 99%   BMI 27.07 kg/m   Physical Exam Constitutional:      Appearance: Normal appearance.  Eyes:     Extraocular Movements: Extraocular movements intact.  Cardiovascular:     Rate and Rhythm: Normal rate and regular rhythm.  Pulmonary:     Effort: Pulmonary effort is normal.     Breath sounds: Normal breath sounds.  Chest:     Chest wall: No tenderness.  Abdominal:     Palpations: Abdomen is soft.     Comments: Mild epigastric and left upper quadrant tenderness no guarding.  Musculoskeletal:        General: No swelling or tenderness. Normal range of motion.     Right lower leg: No edema.     Left lower leg: No edema.  Skin:    General: Skin is warm and dry.  Neurological:     General: No focal deficit present.     Mental Status: She is alert and oriented to person, place, and time.     Coordination: Coordination normal.    ED Results / Procedures  / Treatments   Labs (all labs ordered are listed, but only abnormal results are displayed) Labs Reviewed  CBC - Abnormal; Notable for the following components:      Result Value   WBC 3.9 (*)    Hemoglobin 15.2 (*)    All other components within normal limits  BASIC METABOLIC PANEL  PREGNANCY, URINE  TROPONIN I (HIGH SENSITIVITY)    EKG EKG Interpretation  Date/Time:  Monday November 24 2020 14:38:31 EDT Ventricular Rate:  72 PR Interval:  124 QRS Duration: 88 QT Interval:  388 QTC Calculation: 424 R Axis:   77 Text Interpretation: Normal sinus rhythm Anterior infarct , age undetermined Abnormal ECG No old tracing to compare Confirmed by 04[birth year]28[birth year]1988 [phone]) on 11/24/2020 2:42:13 PM  Radiology DG Chest 2 View  Result Date: 11/24/2020 CLINICAL DATA:  Chest pain. EXAM: CHEST - 2 VIEW COMPARISON:  Chest x-ray dated December 25, 2015. FINDINGS: The heart size and mediastinal contours are within normal limits. Both lungs are clear. The visualized skeletal structures are unremarkable. IMPRESSION: No active cardiopulmonary disease. Electronically Signed   By: December 27, 2015 M.D.   On: 11/24/2020 15:03    Procedures Procedures   Medications Ordered in ED Medications  famotidine (PEPCID) tablet 40 mg (40 mg Oral Given 11/24/20 1724)    ED Course  I have reviewed the triage vital signs and the nursing notes.  Pertinent labs & imaging results that were available during my care of the patient were reviewed by me and considered in my medical decision making (see chart for details).    MDM Rules/Calculators/A&P                          Patient presents as outlined above.  At this time findings are very suggestive of esophageal spasm.  Patient is very low risk for cardiac ischemic event.  Vital signs are normal.  Troponin is less than 2.  Patient did have multiple episodes of vomiting overnight.  At this time I have low suspicion for Boerhaave's tear.  Patient's pain has significantly  abated and chest x-ray is clear, no  leukocytosis.  Plan will be a week of Pepcid twice daily and Zofran as needed.  Return precautions included in discharge instructions.  Patient is recommended to follow-up with PCP for recheck. Final Clinical Impression(s) / ED Diagnoses Final diagnoses:  Esophageal spasm    Rx / DC Orders ED Discharge Orders          Ordered    famotidine (PEPCID) 40 MG tablet  2 times daily        11/24/20 1718    ondansetron (ZOFRAN ODT) 4 MG disintegrating tablet  Every 4 hours PRN        11/24/20 1718             Arby Barrette, MD 11/24/20 1740

## 2020-11-24 NOTE — Discharge Instructions (Addendum)
1.  Take Pepcid 40 mg twice daily for a week.  You may take Zofran if needed for nausea. 2.  Schedule a recheck with your family doctor. 3.  Return to the emergency department if you get new, worsening or recurring symptoms.

## 2023-03-28 IMAGING — DX DG CHEST 2V
2 series · 2 of 2 positions shown · non-contrast
Comparison: Chest x-ray dated December 25, 2015.

CLINICAL DATA: Chest pain.

EXAM:
CHEST - 2 VIEW

[chest pa]
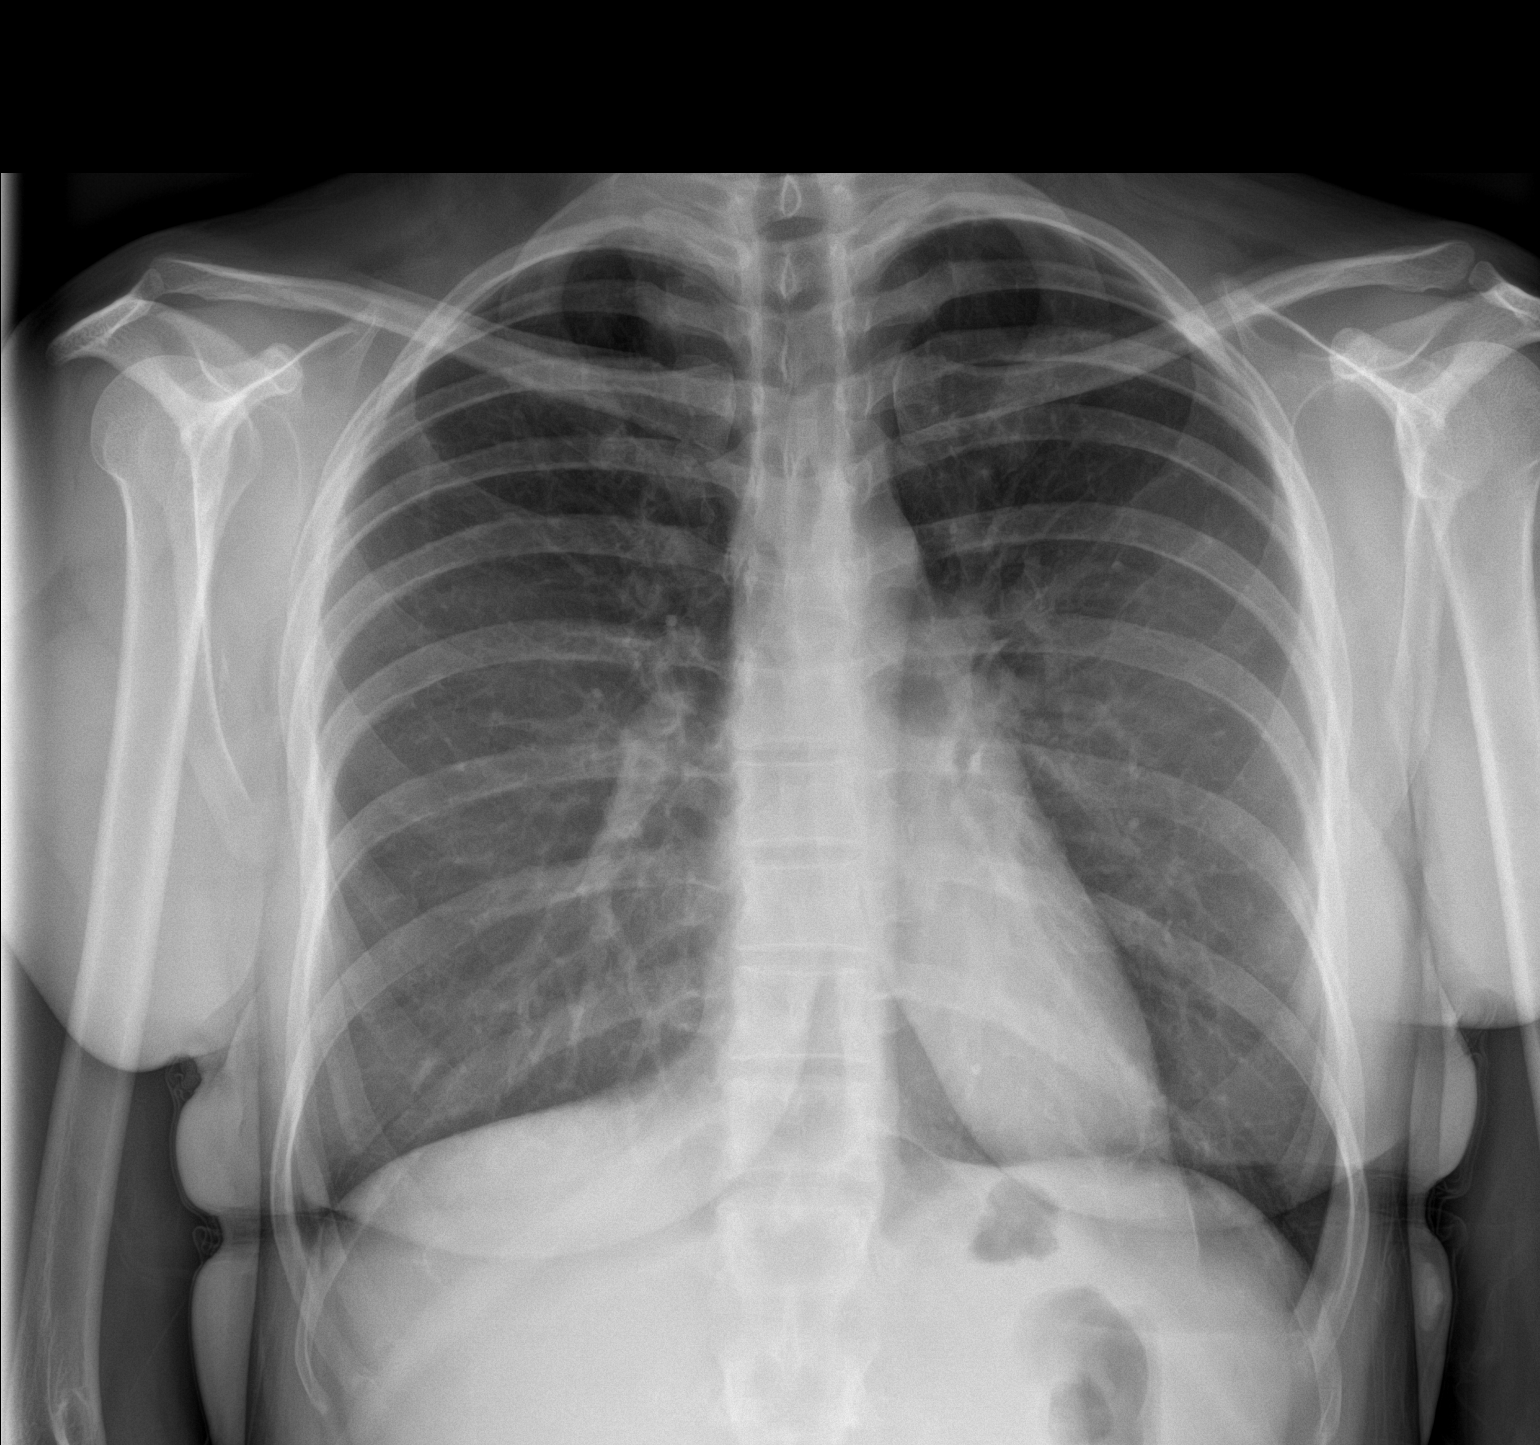

[chest lat]
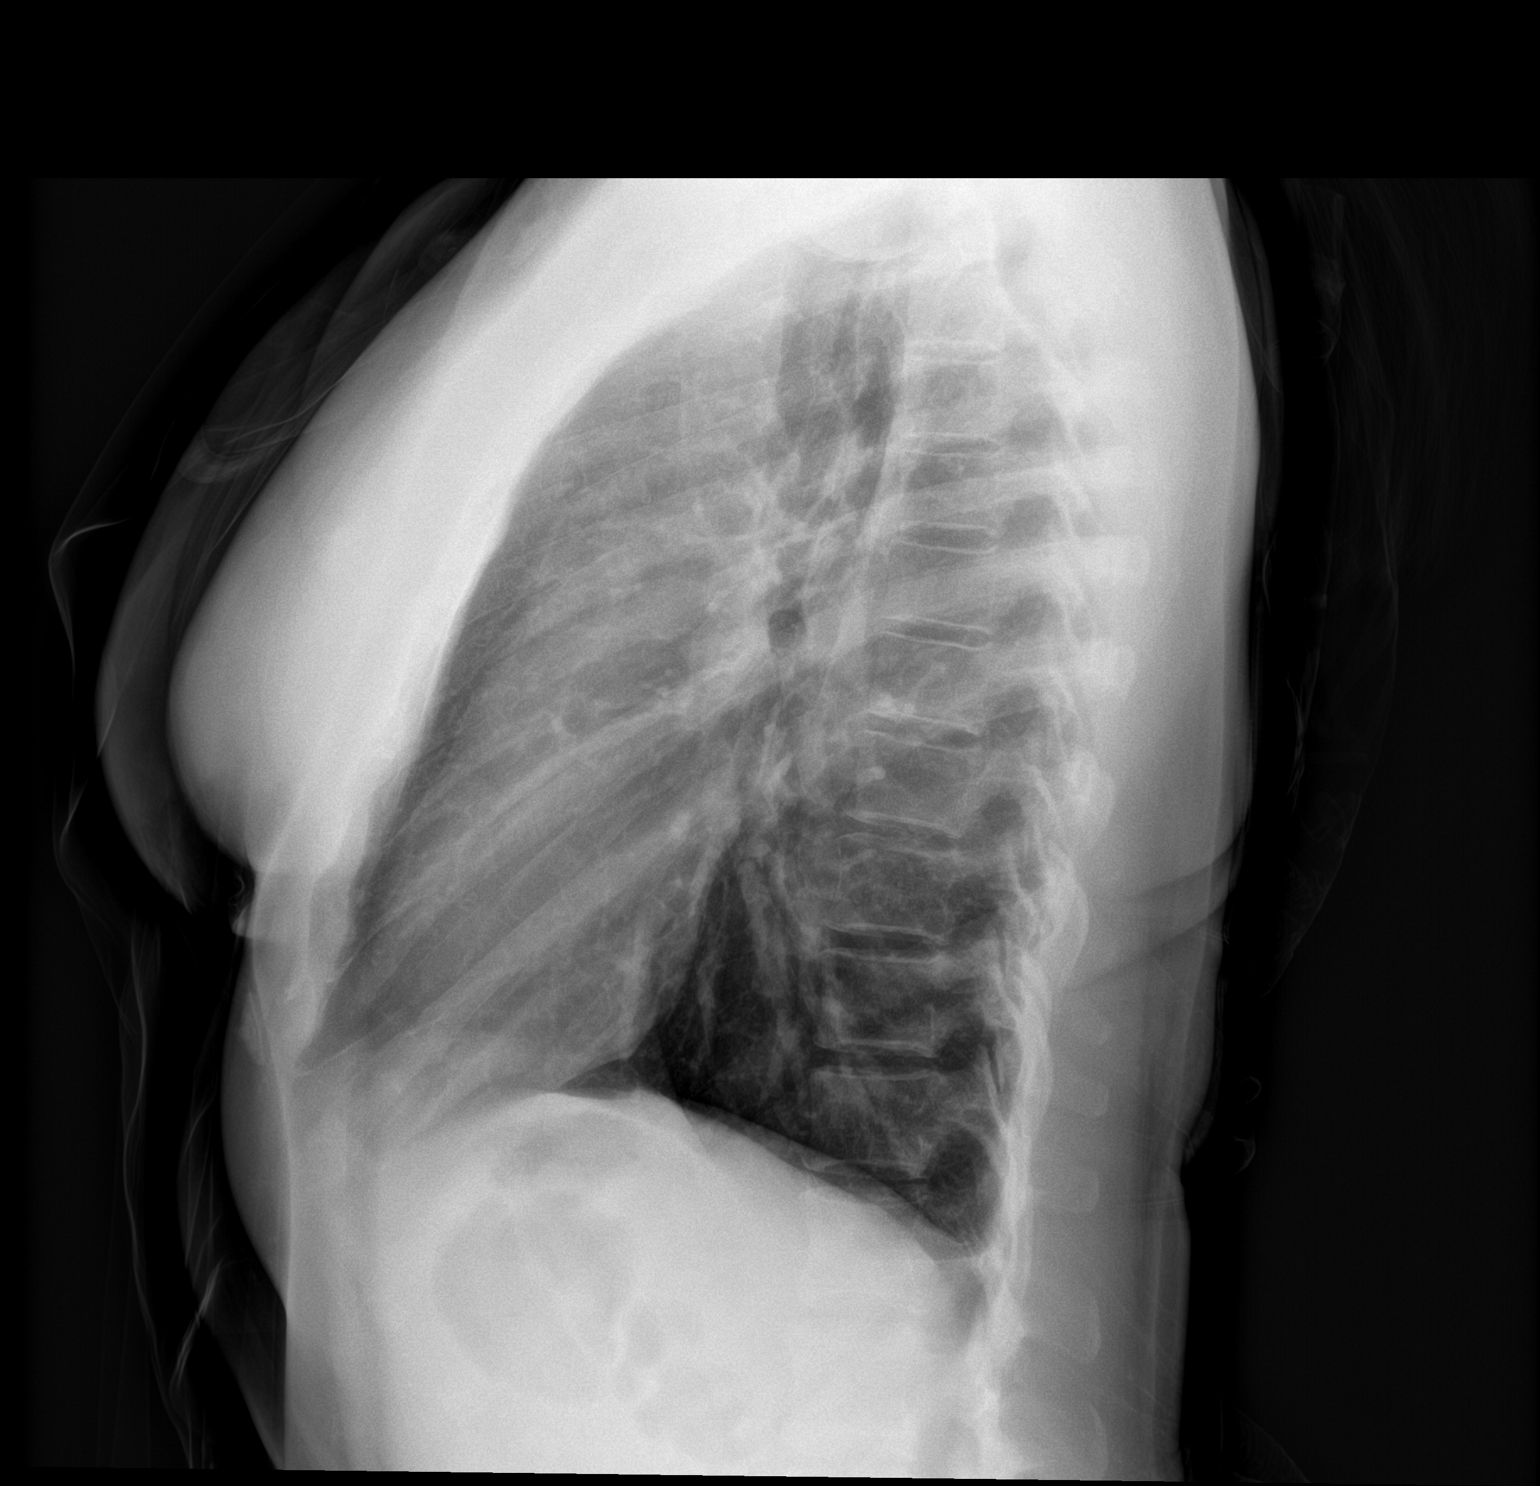

[2 of 2 positions shown; findings below may reference images not displayed]

FINDINGS: The heart size and mediastinal contours are within normal limits.
Both lungs are clear. The visualized skeletal structures are
unremarkable.
IMPRESSION: No active cardiopulmonary disease.

## 2023-04-05 ENCOUNTER — Other Ambulatory Visit (HOSPITAL_BASED_OUTPATIENT_CLINIC_OR_DEPARTMENT_OTHER): Payer: Self-pay

## 2023-04-05 ENCOUNTER — Emergency Department (HOSPITAL_BASED_OUTPATIENT_CLINIC_OR_DEPARTMENT_OTHER): Payer: Self-pay

## 2023-04-05 ENCOUNTER — Encounter (HOSPITAL_BASED_OUTPATIENT_CLINIC_OR_DEPARTMENT_OTHER): Payer: Self-pay

## 2023-04-05 ENCOUNTER — Other Ambulatory Visit: Payer: Self-pay

## 2023-04-05 ENCOUNTER — Emergency Department (HOSPITAL_BASED_OUTPATIENT_CLINIC_OR_DEPARTMENT_OTHER)
Admission: EM | Admit: 2023-04-05 | Discharge: 2023-04-05 | Disposition: A | Discharge status: Home / Self Care | Payer: Self-pay | Attending: Emergency Medicine | Admitting: Emergency Medicine

## 2023-04-05 DIAGNOSIS — D72819 Decreased white blood cell count, unspecified: Secondary | ICD-10-CM | POA: Insufficient documentation

## 2023-04-05 DIAGNOSIS — R110 Nausea: Secondary | ICD-10-CM | POA: Insufficient documentation

## 2023-04-05 DIAGNOSIS — R1084 Generalized abdominal pain: Secondary | ICD-10-CM | POA: Insufficient documentation

## 2023-04-05 LAB — CBC
HCT: 40.4 % (ref 36.0–46.0)
Hemoglobin: 13.9 g/dL (ref 12.0–15.0)
MCH: 31.4 pg (ref 26.0–34.0)
MCHC: 34.4 g/dL (ref 30.0–36.0)
MCV: 91.4 fL (ref 80.0–100.0)
Platelets: 225 10*3/uL (ref 150–400)
RBC: 4.42 MIL/uL (ref 3.87–5.11)
RDW: 11.8 % (ref 11.5–15.5)
WBC: 3.1 10*3/uL — ABNORMAL LOW (ref 4.0–10.5)
nRBC: 0 % (ref 0.0–0.2)

## 2023-04-05 LAB — COMPREHENSIVE METABOLIC PANEL
ALT: 13 U/L (ref 0–44)
AST: 18 U/L (ref 15–41)
Albumin: 4.8 g/dL (ref 3.5–5.0)
Alkaline Phosphatase: 79 U/L (ref 38–126)
Anion gap: 6 (ref 5–15)
BUN: 13 mg/dL (ref 6–20)
CO2: 29 mmol/L (ref 22–32)
Calcium: 9.7 mg/dL (ref 8.9–10.3)
Chloride: 102 mmol/L (ref 98–111)
Creatinine, Ser: 0.8 mg/dL (ref 0.44–1.00)
GFR, Estimated: >60 mL/min (ref >60)
Glucose, Bld: 95 mg/dL (ref 70–99)
Potassium: 3.8 mmol/L (ref 3.5–5.1)
Sodium: 137 mmol/L (ref 135–145)
Total Bilirubin: 0.5 mg/dL (ref 0.3–1.2)
Total Protein: 7.6 g/dL (ref 6.5–8.1)

## 2023-04-05 LAB — URINALYSIS, ROUTINE W REFLEX MICROSCOPIC
Bilirubin Urine: NEGATIVE
Glucose, UA: NEGATIVE mg/dL
Hgb urine dipstick: NEGATIVE
Ketones, ur: NEGATIVE mg/dL
Leukocytes,Ua: NEGATIVE
Nitrite: NEGATIVE
Protein, ur: NEGATIVE mg/dL
Specific Gravity, Urine: 1.01 (ref 1.005–1.030)
pH: 6.5 (ref 5.0–8.0)

## 2023-04-05 LAB — PREGNANCY, URINE: Preg Test, Ur: NEGATIVE

## 2023-04-05 LAB — LIPASE, BLOOD: Lipase: <10 U/L — ABNORMAL LOW (ref 11–51)

## 2023-04-05 MED ORDER — IOHEXOL 300 MG/ML  SOLN
100.0000 mL | Freq: Once | INTRAMUSCULAR | Status: AC | PRN
  Administered 2023-04-05: 100 mL via INTRAVENOUS

## 2023-04-05 NOTE — Discharge Instructions (Signed)
It was a pleasure taking care of you today.  As discussed, your white blood cell count was slightly low.  All of your other labs are reassuring.  CT scan was normal.  I have included the number of [facility] and OB/[facility].  Please call to schedule an appointment for further evaluation.  Return to the ER for new or worsening symptoms.

## 2023-04-05 NOTE — ED Triage Notes (Signed)
In for eval of severe upper abd pain onset 1 month ago. Intermittent nausea. Denies vomiting. Abd feels tight. Last BM this am with diarrhea x3. Hot flashes at night x3 weeks. Denise dysuria.

## 2023-04-05 NOTE — ED Provider Notes (Signed)
[place] EMERGENCY DEPARTMENT AT [facility] Provider Note   CSN: [id number] Arrival date & time: 04/05/23  1023     History  Chief Complaint  Patient presents with   Abdominal Pain    Donna Ryan is a 37 y.o. female with no significant past medical history who presents to the ED due to abdominal pain x 1 month.  Admits to intermittent nausea.  No vomiting.  Pain worse after eating. Gallbladder intact. Admits to a 35 pound weight loss over the past year which has been unintentional.  Admits to significant night sweats over the past 3 to 4 weeks.  Has had abnormal menstrual cycles over the past few months.  No chest pain or shortness of breath.  No fever or chills.  Had her thyroid checked recently which was normal per patient.  No urinary symptoms.  No sick contacts.  History obtained from patient and past medical records. No interpreter used during encounter.       Home Medications Prior to Admission medications   Medication Sig Start Date End Date Taking? Authorizing Provider  famotidine (PEPCID) 40 MG tablet Take 1 tablet (40 mg total) by mouth 2 (two) times daily. 11/24/20   Arby Barrette, MD  ibuprofen (ADVIL,MOTRIN) 800 MG tablet Take 1 tablet (800 mg total) by mouth every 8 (eight) hours as needed. Patient not taking: No sig reported 12/25/15   Lawyer, Cristal Deer, PA-C  ondansetron (ZOFRAN ODT) 4 MG disintegrating tablet Take 1 tablet (4 mg total) by mouth every 4 (four) hours as needed for nausea or vomiting. 11/24/20   Arby Barrette, MD  traMADol (ULTRAM) 50 MG tablet Take 1 tablet (50 mg total) by mouth every 6 (six) hours as needed for severe pain. Patient not taking: No sig reported 12/25/15   Charlestine Night, PA-C      Allergies    Patient has no known allergies.    Review of Systems   Review of Systems  Constitutional:  Positive for appetite change and diaphoresis.  Gastrointestinal:  Positive for abdominal pain.    Physical Exam Updated  Vital Signs BP (!) 125/94 (BP Location: Right Arm)   Pulse (!) 57   Temp 98.8 F (37.1 C)   Resp 16   Ht 5\' 2"  (1.575 m)   Wt 59.4 kg   LMP 02/12/2023 (Exact Date)   SpO2 100%   BMI 23.96 kg/m  Physical Exam Vitals and nursing note reviewed.  Constitutional:      General: She is not in acute distress.    Appearance: She is not ill-appearing.  HENT:     Head: Normocephalic.  Eyes:     Pupils: Pupils are equal, round, and reactive to light.  Cardiovascular:     Rate and Rhythm: Normal rate and regular rhythm.     Pulses: Normal pulses.     Heart sounds: Normal heart sounds. No murmur heard.    No friction rub. No gallop.  Pulmonary:     Effort: Pulmonary effort is normal.     Breath sounds: Normal breath sounds.  Abdominal:     General: Abdomen is flat. There is no distension.     Palpations: Abdomen is soft.     Tenderness: There is abdominal tenderness. There is no guarding or rebound.     Comments: Mild diffuse tenderness. No rebound or guarding.  Musculoskeletal:        General: Normal range of motion.     Cervical back: Neck supple.  Skin:  General: Skin is warm and dry.  Neurological:     General: No focal deficit present.     Mental Status: She is alert.  Psychiatric:        Mood and Affect: Mood normal.        Behavior: Behavior normal.     ED Results / Procedures / Treatments   Labs (all labs ordered are listed, but only abnormal results are displayed) Labs Reviewed  LIPASE, BLOOD - Abnormal; Notable for the following components:      Result Value   Lipase <10 (*)    All other components within normal limits  CBC - Abnormal; Notable for the following components:   WBC 3.1 (*)    All other components within normal limits  COMPREHENSIVE METABOLIC PANEL  URINALYSIS, ROUTINE W REFLEX MICROSCOPIC  PREGNANCY, URINE    EKG None  Radiology CT ABDOMEN PELVIS W CONTRAST  Result Date: 04/05/2023 CLINICAL DATA:  Pancreatitis, acute, severe EXAM: CT  ABDOMEN AND PELVIS WITH CONTRAST TECHNIQUE: Multidetector CT imaging of the abdomen and pelvis was performed using the standard protocol following bolus administration of intravenous contrast. RADIATION DOSE REDUCTION: This exam was performed according to the departmental dose-optimization program which includes automated exposure control, adjustment of the mA and/or kV according to patient size and/or use of iterative reconstruction technique. CONTRAST:  OMNIPAQUE IOHEXOL 300 MG/ML  SOLN COMPARISON:  Chest XR, 12/25/2018. FINDINGS: Lower chest: No acute abnormality. Hepatobiliary: Normal volume liver with smooth contour. Sub-1 cm superior hepatic dome hypodensity, too small to characterize likely a small cyst versus hemangioma. No gallstones, gallbladder wall thickening, or biliary dilatation. Pancreas: No pancreatic ductal dilatation or surrounding inflammatory changes. Spleen: Normal in size without focal abnormality. Adrenals/Urinary Tract: Adrenal glands are unremarkable. Kidneys are normal, without renal calculi, focal lesion, or hydronephrosis. Bladder is unremarkable. Stomach/Bowel: Stomach is within normal limits. Appendix is not definitively visualized. No evidence of bowel wall thickening, distention, or inflammatory changes. Vascular/Lymphatic: No significant vascular findings are present. No enlarged abdominal or pelvic lymph nodes. Reproductive: Uterus and adnexa are unremarkable. Other: No abdominal wall hernia or abnormality. No abdominopelvic ascites. Musculoskeletal: No acute or significant osseous findings. IMPRESSION: No acute abdominopelvic process. Electronically Signed   By: Roanna Banning M.D.   On: 04/05/2023 14:48    Procedures Procedures    Medications Ordered in ED Medications  iohexol (OMNIPAQUE) 300 MG/ML solution 100 mL (100 mLs Intravenous Contrast Given 04/05/23 1252)    ED Course/ Medical Decision Making/ A&P                                 Medical Decision  Making Amount and/or Complexity of Data Reviewed Independent Historian: spouse Labs: ordered. Decision-making details documented in ED Course. Radiology: ordered and independent interpretation performed. Decision-making details documented in ED Course.  Risk Prescription drug management.   This patient presents to the ED for concern of abdominal pain, this involves an extensive number of treatment options, and is a complaint that carries with it a high risk of complications and morbidity.  The differential diagnosis includes pancreatitis, gastritis, acute cholecystitis, malignancy, etc  37 year old female presents to the ED due to abdominal pain x 1 month associated with night sweats and intermittent menstrual cycles.  Also admits to a 35 pound unintentional weight loss over the past year.  Upon arrival patient afebrile, not tachycardic or hypoxic.  Patient in no acute distress.  Reassuring physical exam.  Abdomen soft, nondistended with very mild diffuse tenderness.  Routine labs ordered.  CT abdomen ordered.  Patient notes she had a thyroid panel checked recently which was normal per patient.  CBC significant for leukopenia 3.1.  Normal hemoglobin and platelets.  Lipase without evidence of pancreatitis.  CMP unremarkable.  Normal renal function.  No major electrolyte derangements.  Pregnancy test negative.  Low suspicion for ectopic pregnancy.  UA negative for signs of infection.  CT abdomen personally reviewed and interpreted which is negative for any acute abnormalities.   On reassessment, patient denies any further abdominal pain.  Patient stable for discharge.  OB/GYN and [facility] number given to patient at discharge and advised to call to schedule an appointment for further evaluation.  Unclear etiology of symptoms.  Patient stable for discharge. Strict ED precautions discussed with patient. Patient states understanding and agrees to plan. Patient discharged home in no acute distress and  stable vitals  Co morbidities that complicate the patient evaluation  depression  Social Determinants of Health:  No PCP on file  Test / Admission - Considered:  Thyroid panel; however patient notes she had one recently which was normal          Final Clinical Impression(s) / ED Diagnoses Final diagnoses:  Generalized abdominal pain    Rx / DC Orders ED Discharge Orders     None         Jesusita Oka 04/05/23 1536    Laurence Spates, MD 04/05/23 Ernestina Columbia

## 2023-04-05 NOTE — ED Notes (Signed)
Discharge paperwork given and verbally understood. 

## 2023-08-17 ENCOUNTER — Encounter: Payer: Self-pay | Admitting: Gastroenterology

## 2023-08-17 ENCOUNTER — Other Ambulatory Visit

## 2023-08-17 ENCOUNTER — Ambulatory Visit: Payer: Self-pay | Admitting: Gastroenterology

## 2023-08-17 VITALS — BP 110/70 | HR 59 | Ht 63.0 in | Wt 137.0 lb

## 2023-08-17 DIAGNOSIS — K5904 Chronic idiopathic constipation: Secondary | ICD-10-CM

## 2023-08-17 DIAGNOSIS — R142 Eructation: Secondary | ICD-10-CM

## 2023-08-17 DIAGNOSIS — R12 Heartburn: Secondary | ICD-10-CM | POA: Diagnosis not present

## 2023-08-17 DIAGNOSIS — G8929 Other chronic pain: Secondary | ICD-10-CM

## 2023-08-17 DIAGNOSIS — R1013 Epigastric pain: Secondary | ICD-10-CM

## 2023-08-17 DIAGNOSIS — R140 Abdominal distension (gaseous): Secondary | ICD-10-CM

## 2023-08-17 DIAGNOSIS — R634 Abnormal weight loss: Secondary | ICD-10-CM

## 2023-08-17 DIAGNOSIS — R110 Nausea: Secondary | ICD-10-CM

## 2023-08-17 DIAGNOSIS — R630 Anorexia: Secondary | ICD-10-CM

## 2023-08-17 LAB — SEDIMENTATION RATE: Sed Rate: 10 mm/h (ref 0–20)

## 2023-08-17 LAB — CBC WITH DIFFERENTIAL/PLATELET
Basophils Absolute: 0 10*3/uL (ref 0.0–0.1)
Basophils Relative: 0.7 % (ref 0.0–3.0)
Eosinophils Absolute: 0.1 10*3/uL (ref 0.0–0.7)
Eosinophils Relative: 2.3 % (ref 0.0–5.0)
HCT: 40.9 % (ref 36.0–46.0)
Hemoglobin: 13.9 g/dL (ref 12.0–15.0)
Lymphocytes Relative: 41.8 % (ref 12.0–46.0)
Lymphs Abs: 1.8 10*3/uL (ref 0.7–4.0)
MCHC: 33.9 g/dL (ref 30.0–36.0)
MCV: 94.7 fl (ref 78.0–100.0)
Monocytes Absolute: 0.4 10*3/uL (ref 0.1–1.0)
Monocytes Relative: 8.8 % (ref 3.0–12.0)
Neutro Abs: 2 10*3/uL (ref 1.4–7.7)
Neutrophils Relative %: 46.4 % (ref 43.0–77.0)
Platelets: 275 10*3/uL (ref 150.0–400.0)
RBC: 4.32 Mil/uL (ref 3.87–5.11)
RDW: 12.8 % (ref 11.5–15.5)
WBC: 4.3 10*3/uL (ref 4.0–10.5)

## 2023-08-17 LAB — HEPATIC FUNCTION PANEL
ALT: 16 U/L (ref 0–35)
AST: 21 U/L (ref 0–37)
Albumin: 4.8 g/dL (ref 3.5–5.2)
Alkaline Phosphatase: 69 U/L (ref 39–117)
Bilirubin, Direct: 0.1 mg/dL (ref 0.0–0.3)
Total Bilirubin: 0.5 mg/dL (ref 0.2–1.2)
Total Protein: 7.2 g/dL (ref 6.0–8.3)

## 2023-08-17 LAB — COMPREHENSIVE METABOLIC PANEL
ALT: 16 U/L (ref 0–35)
AST: 21 U/L (ref 0–37)
Albumin: 4.8 g/dL (ref 3.5–5.2)
Alkaline Phosphatase: 69 U/L (ref 39–117)
BUN: 17 mg/dL (ref 6–23)
CO2: 29 meq/L (ref 19–32)
Calcium: 9.5 mg/dL (ref 8.4–10.5)
Chloride: 102 meq/L (ref 96–112)
Creatinine, Ser: 0.74 mg/dL (ref 0.40–1.20)
GFR: 104.27 mL/min (ref >60.00)
Glucose, Bld: 93 mg/dL (ref 70–99)
Potassium: 4.4 meq/L (ref 3.5–5.1)
Sodium: 138 meq/L (ref 135–145)
Total Bilirubin: 0.5 mg/dL (ref 0.2–1.2)
Total Protein: 7.2 g/dL (ref 6.0–8.3)

## 2023-08-17 LAB — LIPASE: Lipase: 13 U/L (ref 11.0–59.0)

## 2023-08-17 LAB — C-REACTIVE PROTEIN: CRP: <1 mg/dL (ref 0.5–20.0)

## 2023-08-17 LAB — TSH: TSH: 0.79 u[IU]/mL (ref 0.35–5.50)

## 2023-08-17 MED ORDER — FAMOTIDINE 20 MG PO TABS: 20.0000 mg | ORAL_TABLET | Freq: Two times a day (BID) | ORAL | 2 refills | Status: AC

## 2023-08-17 NOTE — Patient Instructions (Signed)
 Your provider has requested that you go to the basement level for lab work before leaving today. Press "B" on the elevator. The lab is located at the first door on the left as you exit the elevator.  You will be contacted by [facility] in the next 2 days to arrange a ultrasound.  The number on your caller ID will be [phone], please answer when they call.  If you have not heard from them in 2 days please call [phone] to schedule.    You have been scheduled for an endoscopy. Please follow written instructions given to you at your visit today.  [facility] has implemented a new process for scheduling procedures.  Please note your arrival time for the [facility] is the appointment time that is shown on your written instructions.  Please do not arrive one hour prior to the time listed in your instructions.  Please ignore any outside notifications to arrive one hour early.  We apologize for any confusion and look forward to seeing you for your procedure.   If you use inhalers (even only as needed), please bring them with you on the day of your procedure.  If you take any of the following medications, they will need to be adjusted prior to your procedure:   DO NOT TAKE 7 DAYS PRIOR TO TEST- Trulicity (dulaglutide) Ozempic, Wegovy (semaglutide) Mounjaro (tirzepatide) Bydureon Bcise (exanatide extended release)  DO NOT TAKE 1 DAY PRIOR TO YOUR TEST Rybelsus (semaglutide) Adlyxin (lixisenatide) Victoza (liraglutide) Byetta (exanatide) ___________________________________________________________________________  Due to recent changes in healthcare laws, you may see the results of your imaging and laboratory studies on MyChart before your provider has had a chance to review them.  We understand that in some cases there may be results that are confusing or concerning to you. Not all laboratory results come back in the same time frame and the provider may be  waiting for multiple results in order to interpret others.  Please give [place] 48 hours in order for your provider to thoroughly review all the results before contacting the office for clarification of your results.   _______________________________________________________  If your blood pressure at your visit was 140/90 or greater, please contact your primary care physician to follow up on this.  _______________________________________________________  If you are age 61 or older, your body mass index should be between 23-30. Your Body mass index is 24.27 kg/m. If this is out of the aforementioned range listed, please consider follow up with your Primary Care Provider.  If you are age 74 or younger, your body mass index should be between 19-25. Your Body mass index is 24.27 kg/m. If this is out of the aformentioned range listed, please consider follow up with your Primary Care Provider.   ________________________________________________________  The [facility] providers would like to encourage you to use [facility] to communicate with providers for non-urgent requests or questions.  Due to long hold times on the telephone, sending your provider a message by [facility] may be a faster and more efficient way to get a response.  Please allow 48 business hours for a response.  Please remember that this is for non-urgent requests.  _______________________________________________________  Thank you for trusting me with your gastrointestinal care!   Margarite Gouge May, NP

## 2023-08-17 NOTE — Progress Notes (Signed)
 Chief Complaint: ED follow-up, abdominal pain Primary GI Doctor: Dr. Rhea Belton  HPI:  Donna Ryan is a 38 year old female Donna Ryan with no significant medical history of presents for evaluation of generalized abdominal pain and recent ED visit.  On 04/05/2023 Donna Ryan presented to [facility] ED for complaints of abdominal pain for 1 month.  Donna Ryan also had intermediate nausea.  Pain worse after eating.  Admits to a 35 pound weight loss over the past year.  WBC 3.1.  Hemoglobin 13.9.  Normal kidney function.  Normal liver function.  Lipase less than 10.  CT scan abdomen pelvis with contrast with no acute abdominopelvic process.  Interval History    Donna Ryan presents with several gastrointestinal complaints, accompanied by her spouse. She was evaluated last October in ED and was under the impression she had acute pancreatitis. After looking at the lab work and imaging we discussed it was a differential but not inclusive on the data. She had further concerns because she recently went to get life insurance policy and had abnormal lab work for low WBC and liver enzymes so they advised her seen specialist.      Donna Ryan presents with main complaint of epigastric abdominal pain she states started about 2 years ago. It will be brought on by eating certain foods. Her symptoms are worse with fried foods, gluten, dairy or sweets. The upper abdominal pain will radiate to her back. She will eat small amount of food and feel like she ate a full course meal.  She states on average she will go 4-7 days without bowel movement. She has trialed and failed high fiber diet, OTC laxatives, ducolax, and probiotics. She even had samples of Linzess a friend gave her without any improvement. No blood in stool.     She also complains of pyrosis at least twice a week. She also complains of a lot of burping as well. No carbonated beverages. Mild intermittent nausea, no vomiting. She states her normal weight prior to last year was  150-160lbs. She states last year she went down to as low as 118lbs. Average weight now is 130lbs. Appetite is poor. Denies NSAID use. Social drinking. Marajuana daily use, reports she only takes two puffs. Will go days without using. Also mentions extreme fatigue.  Donna Ryan's family history includes sister with celiac disease, mother and grandmother with thyroid disease.   Wt Readings from Last 3 Encounters:  08/17/23 137 lb (62.1 kg)  04/05/23 131 lb (59.4 kg)  11/24/20 148 lb (67.1 kg)    Past Medical History:  Diagnosis Date   Pancreatitis    Past Surgical History:  Procedure Laterality Date   PLACEMENT OF BREAST IMPLANTS     TUMOR REMOVAL     No current outpatient medications on file.   No current facility-administered medications for this visit.   Allergies as of 08/17/2023   (No Known Allergies)   Family History  Problem Relation Age of Onset   Skin cancer Mother    Skin cancer Maternal Grandmother    Review of Systems:    Constitutional: No weight loss, fever, chills, weakness or fatigue HEENT: Eyes: No change in vision               Ears, Nose, Throat:  No change in hearing or congestion Skin: No rash or itching Cardiovascular: No chest pain, chest pressure or palpitations   Respiratory: No SOB or cough Gastrointestinal: See HPI and otherwise negative Genitourinary: No dysuria or change in urinary frequency Neurological: No  headache, dizziness or syncope Musculoskeletal: No new muscle or joint pain Hematologic: No bleeding or bruising Psychiatric: No history of depression or anxiety   Physical Exam:  Vital signs: BP 110/70   Pulse (!) 59   Ht 5\' 3"  (1.6 m)   Wt 137 lb (62.1 kg)   BMI 24.27 kg/m   Constitutional: Pleasant Caucasian female appears to be in NAD, Well developed, Well nourished, alert and cooperative Throat: Oral cavity and pharynx without inflammation, swelling or lesion.  Respiratory: Respirations even and unlabored. Lungs clear to  auscultation bilaterally.   No wheezes, crackles, or rhonchi.  Cardiovascular: Normal S1, S2. Regular rate and rhythm. No peripheral edema, cyanosis or pallor.  Gastrointestinal:  Soft, nondistended, epigastric pain and RUQ abdominal pain with palpation. No rebound or guarding. Normal bowel sounds. No appreciable masses or hepatomegaly. Rectal:  Not performed.  Msk:  Symmetrical without gross deformities. Without edema, no deformity or joint abnormality.  Neurologic:  Alert and  oriented x4;  grossly normal neurologically.  Skin:   Dry and intact without significant lesions or rashes. Psychiatric: Oriented to person, place and time. Demonstrates good judgement and reason without abnormal affect or behaviors.  RELEVANT LABS AND IMAGING: CBC    Latest Ref Rng & Units 04/05/2023   11:20 AM 11/24/2020    3:51 PM 02/14/2019   12:57 AM  CBC  WBC 4.0 - 10.5 K/uL 3.1  3.9  6.5   Hemoglobin 12.0 - 15.0 g/dL 16.1  09.6  04.5   Hematocrit 36.0 - 46.0 % 40.4  44.8  41.8   Platelets 150 - 400 K/uL 225  273  287      CMP     Latest Ref Rng & Units 04/05/2023   11:20 AM 11/24/2020    3:51 PM 02/14/2019   12:57 AM  CMP  Glucose 70 - 99 mg/dL 95  96  409   BUN 6 - 20 mg/dL 13  15  14    Creatinine 0.44 - 1.00 mg/dL 8.11  9.14  7.82   Sodium 135 - 145 mmol/L 137  137  140   Potassium 3.5 - 5.1 mmol/L 3.8  3.6  3.3   Chloride 98 - 111 mmol/L 102  101  108   CO2 22 - 32 mmol/L 29  26  23    Calcium 8.9 - 10.3 mg/dL 9.7  9.6  9.0   Total Protein 6.5 - 8.1 g/dL 7.6   7.8   Total Bilirubin 0.3 - 1.2 mg/dL 0.5   0.3   Alkaline Phos 38 - 126 U/L 79   75   AST 15 - 41 U/L 18   16   ALT 0 - 44 U/L 13   14    04/05/23 CT abdomen pelvis with contrast IMPRESSION: No acute abdominopelvic process.   Assessment: Encounter Diagnoses  Name Primary?   Heartburn Yes   Nausea without vomiting    Abdominal pain, chronic, epigastric    Burping    Chronic idiopathic constipation    Bloating    Weight loss     Poor appetite      38 year old female Donna Ryan who presents with several gastrointestinal complaints. For the uncontrolled heartburn with burping I will trial her on Pepcid 20mg  BID for 4 weeks. She does exhibit RUQ/epigastric pain so will schedule RUQ abdominal ultrasound to r/o gallbladder disease. Recheck liver profile and lipase. CT scan was normal 10/24. Despite her stating she only uses marajuana intermittently I recommended she stop  completely. She also has unexplained weight loss, nausea, and poor appetite so will proceed with EGD to rule out gastritis, esophagitis, and/or H.pylori.     Additionally she has chronic constipation that most likely is motility related. Will provide her samples of prosecretory agents Trulance and Linzess. Izela Altier consider Motegrity if these do not work for her. I will also check labs to rule out thyroid disease, inflammatory disease, or celiac. Recommend low fodmap diet.      For the leukopenia I recommended she follow-up with PCP. I will check CBC today to reevaluate.  Plan: - Check CBC, CMP, LFT's,  CRP, TTG IgA, IgA, TSH, lipase  - Start Pepcid 20 mg BID today -samples Linzess , trulance 3mg  provided - low fodmap diet handout provided  -recommend marajuana cessation -order RUQ abdominal u/s to r/o gallbladder disease - Schedule EGD in [facility] with Dr. Rhea Belton. The risks and benefits of EGD with possible biopsies and esophageal dilation were discussed with the Donna Ryan who agrees to proceed. -Follow-up with APP in 2 mths  Thank you for the courtesy of this consult. Please call me with any questions or concerns.   Sherilyn Windhorst, FNP-C [facility] 08/17/2023, 9:10 AM  Cc: No ref. provider found

## 2023-08-18 ENCOUNTER — Encounter: Payer: Self-pay | Admitting: Internal Medicine

## 2023-08-18 ENCOUNTER — Ambulatory Visit: Admitting: Internal Medicine

## 2023-08-18 VITALS — BP 139/97 | HR 57 | Temp 98.8°F | Resp 15 | Ht 63.0 in | Wt 137.0 lb

## 2023-08-18 DIAGNOSIS — K449 Diaphragmatic hernia without obstruction or gangrene: Secondary | ICD-10-CM

## 2023-08-18 DIAGNOSIS — R634 Abnormal weight loss: Secondary | ICD-10-CM

## 2023-08-18 DIAGNOSIS — R110 Nausea: Secondary | ICD-10-CM

## 2023-08-18 DIAGNOSIS — R12 Heartburn: Secondary | ICD-10-CM | POA: Diagnosis not present

## 2023-08-18 DIAGNOSIS — K2289 Other specified disease of esophagus: Secondary | ICD-10-CM

## 2023-08-18 DIAGNOSIS — K219 Gastro-esophageal reflux disease without esophagitis: Secondary | ICD-10-CM

## 2023-08-18 DIAGNOSIS — R1013 Epigastric pain: Secondary | ICD-10-CM

## 2023-08-18 LAB — TISSUE TRANSGLUTAMINASE ABS,IGG,IGA
(tTG) Ab, IgA: <1 U/mL
(tTG) Ab, IgG: <1 U/mL

## 2023-08-18 LAB — IGA: Immunoglobulin A: 244 mg/dL (ref 47–310)

## 2023-08-18 MED ORDER — SODIUM CHLORIDE 0.9 % IV SOLN: 500.0000 mL | INTRAVENOUS | Status: DC

## 2023-08-18 MED ORDER — PANTOPRAZOLE SODIUM 40 MG PO TBEC: 40.0000 mg | DELAYED_RELEASE_TABLET | Freq: Every day | ORAL | 1 refills | Status: DC

## 2023-08-18 NOTE — Progress Notes (Signed)
 Report given to PACU, vss

## 2023-08-18 NOTE — Patient Instructions (Signed)
 Educational handout provided to patient related to Hiatal Hernia  Resume previous diet  Continue present medications  Awaiting pathology results   YOU HAD AN ENDOSCOPIC PROCEDURE TODAY AT THE [facility]:   Refer to the procedure report that was given to you for any specific questions about what was found during the examination.  If the procedure report does not answer your questions, please call your gastroenterologist to clarify.  If you requested that your care partner not be given the details of your procedure findings, then the procedure report has been included in a sealed envelope for you to review at your convenience later.  YOU SHOULD EXPECT: Some feelings of bloating in the abdomen. Passage of more gas than usual.  Walking can help get rid of the air that was put into your GI tract during the procedure and reduce the bloating. If you had a lower endoscopy (such as a colonoscopy or flexible sigmoidoscopy) you may notice spotting of blood in your stool or on the toilet paper. If you underwent a bowel prep for your procedure, you may not have a normal bowel movement for a few days.  Please Note:  You might notice some irritation and congestion in your nose or some drainage.  This is from the oxygen used during your procedure.  There is no need for concern and it should clear up in a day or so.  SYMPTOMS TO REPORT IMMEDIATELY:  Following upper endoscopy (EGD)  Vomiting of blood or coffee ground material  New chest pain or pain under the shoulder blades  Painful or persistently difficult swallowing  New shortness of breath  Fever of 100F or higher  Black, tarry-looking stools  For urgent or emergent issues, a gastroenterologist can be reached at any hour by calling [phone]. Do not use MyChart messaging for urgent concerns.    DIET:  We do recommend a small meal at first, but then you may proceed to your regular diet.  Drink plenty of fluids but you should avoid  alcoholic beverages for 24 hours.  ACTIVITY:  You should plan to take it easy for the rest of today and you should NOT DRIVE or use heavy machinery until tomorrow (because of the sedation medicines used during the test).    FOLLOW UP: Our staff will call the number listed on your records the next business day following your procedure.  We will call around 7:15- 8:00 am to check on you and address any questions or concerns that you may have regarding the information given to you following your procedure. If we do not reach you, we will leave a message.     If any biopsies were taken you will be contacted by phone or by letter within the next 1-3 weeks.  Please call us at [phone] if you have not heard about the biopsies in 3 weeks.    SIGNATURES/CONFIDENTIALITY: You and/or your care partner have signed paperwork which will be entered into your electronic medical record.  These signatures attest to the fact that that the information above on your After Visit Summary has been reviewed and is understood.  Full responsibility of the confidentiality of this discharge information lies with you and/or your care-partner.

## 2023-08-18 NOTE — Progress Notes (Signed)
 Called to room to assist during endoscopic procedure.  Patient ID and intended procedure confirmed with present staff. Received instructions for my participation in the procedure from the performing physician.

## 2023-08-18 NOTE — Progress Notes (Signed)
1118 Robinul 0.1 mg IV given due large amount of secretions upon assessment.  MD made aware, vss 

## 2023-08-18 NOTE — Progress Notes (Signed)
 1127  Pt experienced laryngeal spasm with jaw thrust performed. vss

## 2023-08-18 NOTE — Progress Notes (Signed)
 Please see office note dated 08/17/2023 for details and current H&P  Patient presenting for upper endoscopy to evaluate heartburn with belching as well as epigastric and right upper quadrant pain  She remains appropriate for upper endoscopy in the [facility] today

## 2023-08-18 NOTE — Progress Notes (Signed)
 Addendum: Reviewed and agree with assessment and management plan. Asha Grumbine, Carie Caddy, MD

## 2023-08-18 NOTE — Op Note (Signed)
 [facility] Patient Name: Donna Ryan Procedure Date: 08/18/2023 11:17 AM MRN: [id number] Endoscopist: Beverley Fiedler , MD, [id number] Age: 38 Referring MD:  Date of Birth: November 15, 1985 Gender: Female Account #: [id number] Procedure:                Upper GI endoscopy Indications:              Epigastric abdominal pain, Heartburn, Nausea with                            vomiting, Weight loss Medicines:                Monitored Anesthesia Care Procedure:                Pre-Anesthesia Assessment:                           - Prior to the procedure, a History and Physical                            was performed, and patient medications and                            allergies were reviewed. The patient's tolerance of                            previous anesthesia was also reviewed. The risks                            and benefits of the procedure and the sedation                            options and risks were discussed with the patient.                            All questions were answered, and informed consent                            was obtained. Prior Anticoagulants: The patient has                            taken no anticoagulant or antiplatelet agents. ASA                            Grade Assessment: II - A patient with mild systemic                            disease. After reviewing the risks and benefits,                            the patient was deemed in satisfactory condition to                            undergo the procedure.  After obtaining informed consent, the endoscope was                            passed under direct vision. Throughout the                            procedure, the patient's blood pressure, pulse, and                            oxygen saturations were monitored continuously. The                            Olympus Scope [id number] was introduced through the                            mouth, and advanced to the  second part of duodenum.                            The upper GI endoscopy was accomplished without                            difficulty. The patient tolerated the procedure                            well. Scope In: Scope Out: Findings:                 Mild mucosal changes characterized by granularity                            and subtle circumferential mucosal folds were found                            in the middle third of the esophagus and in the                            lower third of the esophagus. Biopsies were taken                            with a cold forceps for histology.                           A 2 cm hiatal hernia was present.                           The gastroesophageal flap valve was visualized                            endoscopically and classified as Hill Grade IV (no                            fold, wide open lumen, hiatal hernia present).                           The  entire examined stomach was normal. Biopsies                            were taken with a cold forceps for Helicobacter                            pylori testing.                           The examined duodenum was normal. Biopsies for                            histology were taken with a cold forceps for                            evaluation of celiac disease. Complications:            No immediate complications. Estimated Blood Loss:     Estimated blood loss was minimal. Impression:               - Granular mucosa in the esophagus likely related                            to acid reflux. Biopsied.                           - 2 cm hiatal hernia.                           - Normal stomach. Biopsied to exclude H. Pylori                            infection.                           - Normal examined duodenum. Biopsied to exclude                            celiac and enteritis. Recommendation:           - Patient has a contact number available for                            emergencies. The  signs and symptoms of potential                            delayed complications were discussed with the                            patient. Return to normal activities tomorrow.                            Written discharge instructions were provided to the                            patient.                           -  Resume previous diet.                           - Continue present medications.                           - Trial of pantoprazole 40 mg once daily (to                            replace famotidine) while waiting on biopsy results                            and gallbladder ultrasound. If not helpful and                            persistent symptoms HIDA and GES could be                            considered also.                           - Await pathology results. Beverley Fiedler, MD 08/18/2023 11:36:42 AM This report has been signed electronically.

## 2023-08-19 ENCOUNTER — Telehealth: Payer: Self-pay

## 2023-08-19 NOTE — Telephone Encounter (Signed)
  Follow up Call-     08/18/2023   10:28 AM  Call back number  Post procedure Call Back phone  # [phone]  Permission to leave phone message Yes     Patient questions:  Do you have a fever, pain , or abdominal swelling? No. Pain Score  0 *  Have you tolerated food without any problems? Yes.    Have you been able to return to your normal activities? Yes.    Do you have any questions about your discharge instructions: Diet   No. Medications  No. Follow up visit  No.  Do you have questions or concerns about your Care? No.  Actions: * If pain score is 4 or above: No action needed, pain <4.

## 2023-08-22 LAB — SURGICAL PATHOLOGY

## 2023-08-23 ENCOUNTER — Ambulatory Visit (HOSPITAL_COMMUNITY)
Admission: RE | Admit: 2023-08-23 | Discharge: 2023-08-23 | Disposition: A | Discharge status: Home / Self Care | Source: Ambulatory Visit | Attending: Gastroenterology | Admitting: Gastroenterology

## 2023-08-23 DIAGNOSIS — R142 Eructation: Secondary | ICD-10-CM | POA: Insufficient documentation

## 2023-08-23 DIAGNOSIS — R110 Nausea: Secondary | ICD-10-CM | POA: Insufficient documentation

## 2023-08-23 DIAGNOSIS — R634 Abnormal weight loss: Secondary | ICD-10-CM | POA: Insufficient documentation

## 2023-08-23 DIAGNOSIS — R1013 Epigastric pain: Secondary | ICD-10-CM | POA: Diagnosis present

## 2023-08-23 DIAGNOSIS — G8929 Other chronic pain: Secondary | ICD-10-CM | POA: Insufficient documentation

## 2023-08-23 DIAGNOSIS — R140 Abdominal distension (gaseous): Secondary | ICD-10-CM | POA: Diagnosis present

## 2023-08-23 DIAGNOSIS — R630 Anorexia: Secondary | ICD-10-CM | POA: Diagnosis present

## 2023-08-23 DIAGNOSIS — R12 Heartburn: Secondary | ICD-10-CM | POA: Diagnosis present

## 2023-08-23 DIAGNOSIS — K5904 Chronic idiopathic constipation: Secondary | ICD-10-CM | POA: Diagnosis present

## 2023-08-29 ENCOUNTER — Encounter: Payer: Self-pay | Admitting: Internal Medicine

## 2023-09-23 ENCOUNTER — Emergency Department (HOSPITAL_BASED_OUTPATIENT_CLINIC_OR_DEPARTMENT_OTHER): Admitting: Radiology

## 2023-09-23 ENCOUNTER — Other Ambulatory Visit: Payer: Self-pay

## 2023-09-23 ENCOUNTER — Encounter (HOSPITAL_BASED_OUTPATIENT_CLINIC_OR_DEPARTMENT_OTHER): Payer: Self-pay | Admitting: Emergency Medicine

## 2023-09-23 ENCOUNTER — Emergency Department (HOSPITAL_BASED_OUTPATIENT_CLINIC_OR_DEPARTMENT_OTHER)
Admission: EM | Admit: 2023-09-23 | Discharge: 2023-09-23 | Disposition: A | Discharge status: Home / Self Care | Attending: Emergency Medicine | Admitting: Emergency Medicine

## 2023-09-23 DIAGNOSIS — W108XXA Fall (on) (from) other stairs and steps, initial encounter: Secondary | ICD-10-CM | POA: Diagnosis not present

## 2023-09-23 DIAGNOSIS — W19XXXA Unspecified fall, initial encounter: Secondary | ICD-10-CM

## 2023-09-23 DIAGNOSIS — S7001XA Contusion of right hip, initial encounter: Secondary | ICD-10-CM | POA: Diagnosis not present

## 2023-09-23 DIAGNOSIS — S99911A Unspecified injury of right ankle, initial encounter: Secondary | ICD-10-CM | POA: Diagnosis present

## 2023-09-23 DIAGNOSIS — S93401A Sprain of unspecified ligament of right ankle, initial encounter: Secondary | ICD-10-CM | POA: Insufficient documentation

## 2023-09-23 LAB — PREGNANCY, URINE: Preg Test, Ur: NEGATIVE

## 2023-09-23 MED ORDER — METHOCARBAMOL 500 MG PO TABS
500.0000 mg | ORAL_TABLET | Freq: Three times a day (TID) | ORAL | 0 refills | Status: DC | PRN
  Filled 2023-09-23: qty 8, 3d supply, fill #0

## 2023-09-23 MED ORDER — KETOROLAC TROMETHAMINE 15 MG/ML IJ SOLN
15.0000 mg | Freq: Once | INTRAMUSCULAR | Status: AC
  Administered 2023-09-23: 15 mg via INTRAMUSCULAR
  Filled 2023-09-23: qty 1

## 2023-09-23 MED ORDER — METHOCARBAMOL 500 MG PO TABS: 500.0000 mg | ORAL_TABLET | Freq: Three times a day (TID) | ORAL | 0 refills | Status: AC | PRN

## 2023-09-23 NOTE — ED Triage Notes (Signed)
 Fall down basement steps. About 5-6 steps Pain in right hip and ankle, numbness  Happened around 3:30pm

## 2023-09-23 NOTE — ED Provider Notes (Signed)
 [place] EMERGENCY DEPARTMENT AT [facility] Provider Note   CSN: [id number] Arrival date & time: 09/23/23  1549     History  Chief Complaint  Patient presents with   Donna Ryan    Donna Ryan is a 38 y.o. female.   Fall  Patient presents with.  Fell down some steps.  Fell down around 5 or 6 stairs.  Complaining of pain in right ankle and right hip.  States is severe.  Happened this afternoon.  Denies hitting head.  Not on blood thinners.  No other pain besides the right lower extremity.    Past Medical History:  Diagnosis Date   Pancreatitis     Home Medications Prior to Admission medications   Medication Sig Start Date End Date Taking? Authorizing Provider  famotidine (PEPCID) 20 MG tablet Take 1 tablet (20 mg total) by mouth 2 (two) times daily. 08/17/23   May, Deanna J, NP  methocarbamol (ROBAXIN) 500 MG tablet Take 1 tablet (500 mg total) by mouth every 8 (eight) hours as needed for muscle spasms. 09/23/23   Mozell Arias, MD  pantoprazole (PROTONIX) 40 MG tablet Take 1 tablet (40 mg total) by mouth daily. 08/18/23   Pyrtle, Amber Bail, MD      Allergies    Patient has no known allergies.    Review of Systems   Review of Systems  Physical Exam Updated Vital Signs BP 118/84   Pulse 62   Temp 98.3 F (36.8 C) (Oral)   Resp 20   SpO2 100%  Physical Exam Vitals and nursing note reviewed.  HENT:     Head: Atraumatic.  Cardiovascular:     Rate and Rhythm: Regular rhythm.  Chest:     Chest wall: No tenderness.  Abdominal:     Tenderness: There is no abdominal tenderness.  Musculoskeletal:        General: Tenderness present.     Comments: Tenderness to right hip laterally.  Tenderness to right ankle.  No foot or knee tenderness.  Skin intact.  Pulse intact in foot.  Neurological:     Mental Status: She is alert and oriented to person, place, and time.     ED Results / Procedures / Treatments   Labs (all labs ordered are listed, but only abnormal  results are displayed) Labs Reviewed  PREGNANCY, URINE    EKG None  Radiology DG Ankle Complete Left Result Date: 09/23/2023 CLINICAL DATA:  Fall, left ankle pain EXAM: LEFT ANKLE COMPLETE - 3+ VIEW COMPARISON:  None Available. FINDINGS: There is no evidence of fracture, dislocation, or joint effusion. There is no evidence of arthropathy or other focal bone abnormality. Soft tissues are unremarkable. IMPRESSION: Negative. Electronically Signed   By: Worthy Heads M.D.   On: 09/23/2023 19:38   DG Ankle Complete Right Result Date: 09/23/2023 CLINICAL DATA:  Fall, right ankle pain EXAM: RIGHT ANKLE - COMPLETE 3+ VIEW COMPARISON:  None Available. FINDINGS: There is no evidence of fracture, dislocation, or joint effusion. There is no evidence of arthropathy or other focal bone abnormality. Moderate soft tissue swelling superficial to the lateral malleolus. No ankle effusion. IMPRESSION: 1. Lateral soft tissue swelling. No fracture or dislocation. Electronically Signed   By: Worthy Heads M.D.   On: 09/23/2023 19:32   DG Hip Unilat W or Wo Pelvis 2-3 Views Right Result Date: 09/23/2023 CLINICAL DATA:  Fall EXAM: DG HIP (WITH OR WITHOUT PELVIS) 2-3V RIGHT COMPARISON:  None FINDINGS: There is no evidence of hip fracture  or dislocation. There is no evidence of arthropathy or other focal bone abnormality. IMPRESSION: Negative. Electronically Signed   By: Tyron Gallon M.D.   On: 09/23/2023 19:31    Procedures Procedures    Medications Ordered in ED Medications  ketorolac (TORADOL) 15 MG/ML injection 15 mg (15 mg Intramuscular Given 09/23/23 1625)    ED Course/ Medical Decision Making/ A&P                                 Medical Decision Making Amount and/or Complexity of Data Reviewed Labs: ordered. Radiology: ordered.  Risk Prescription drug management.   Patient with fall.  Ankle and hip pain.  Differential diagnose includes sprains contusions and fractures.  Will get x-ray  imaging.  X-rays reassuring.  No fracture seen.  Discharge home.       Final Clinical Impression(s) / ED Diagnoses Final diagnoses:  Fall, initial encounter  Contusion of right hip, initial encounter  Sprain of right ankle, unspecified ligament, initial encounter    Rx / DC Orders ED Discharge Orders          Ordered    methocarbamol (ROBAXIN) 500 MG tablet  Every 8 hours PRN,   Status:  Discontinued        09/23/23 2023    methocarbamol (ROBAXIN) 500 MG tablet  Every 8 hours PRN        09/23/23 2034              Mozell Arias, MD 09/24/23 1438

## 2023-09-24 ENCOUNTER — Other Ambulatory Visit (HOSPITAL_COMMUNITY): Payer: Self-pay

## 2023-10-25 ENCOUNTER — Telehealth: Payer: Self-pay | Admitting: Gastroenterology

## 2023-10-25 ENCOUNTER — Other Ambulatory Visit: Payer: Self-pay | Admitting: Gastroenterology

## 2023-10-25 ENCOUNTER — Other Ambulatory Visit (HOSPITAL_COMMUNITY): Payer: Self-pay

## 2023-10-25 DIAGNOSIS — K5904 Chronic idiopathic constipation: Secondary | ICD-10-CM

## 2023-10-25 MED ORDER — TRULANCE 3 MG PO TABS
3.0000 mg | ORAL_TABLET | Freq: Every day | ORAL | 0 refills | Status: DC
  Filled 2023-10-25: qty 90, 90d supply, fill #0
  Filled 2023-10-26: qty 30, 30d supply, fill #0

## 2023-10-25 NOTE — Telephone Encounter (Signed)
 Patient called and said she had been given samples of Linzess and something that "starts with a T" and was told to call back when she ran out of samples to let you know which one she preferred.  She prefers the one that starts with the T and would like it called into the [facility] on [address]  Please call patient and advise.  Thank you.

## 2023-10-25 NOTE — Progress Notes (Signed)
 Sent RX for Trulance 3mg  po daily per patient request

## 2023-10-26 ENCOUNTER — Other Ambulatory Visit (HOSPITAL_COMMUNITY): Payer: Self-pay

## 2023-10-26 ENCOUNTER — Telehealth: Admitting: Family Medicine

## 2023-10-26 ENCOUNTER — Telehealth: Payer: Self-pay

## 2023-10-26 DIAGNOSIS — R3989 Other symptoms and signs involving the genitourinary system: Secondary | ICD-10-CM

## 2023-10-26 MED ORDER — CEPHALEXIN 500 MG PO CAPS: 500.0000 mg | ORAL_CAPSULE | Freq: Two times a day (BID) | ORAL | 0 refills | Status: AC

## 2023-10-26 NOTE — Patient Instructions (Addendum)
 Donna Ryan, thank you for joining [name], NP for today's virtual visit.  While this provider is not your primary care provider (PCP), if your PCP is located in our provider database this encounter information will be shared with them immediately following your visit.   A [facility] MyChart account gives you access to today's visit and all your visits, tests, and labs performed at [facility] " click here if you don't have a [facility] MyChart account or go to [email or web address]  Consent: (Patient) Donna Ryan provided verbal consent for this virtual visit at the beginning of the encounter.  Current Medications:  Current Outpatient Medications:    cephALEXin (KEFLEX) 500 MG capsule, Take 1 capsule (500 mg total) by mouth 2 (two) times daily for 7 days., Disp: 14 capsule, Rfl: 0   famotidine  (PEPCID ) 20 MG tablet, Take 1 tablet (20 mg total) by mouth 2 (two) times daily., Disp: 30 tablet, Rfl: 2   methocarbamol  (ROBAXIN ) 500 MG tablet, Take 1 tablet (500 mg total) by mouth every 8 (eight) hours as needed for muscle spasms., Disp: 8 tablet, Rfl: 0   pantoprazole  (PROTONIX ) 40 MG tablet, Take 1 tablet (40 mg total) by mouth daily., Disp: 30 tablet, Rfl: 1   Plecanatide (TRULANCE) 3 MG TABS, Take 1 tablet (3 mg total) by mouth daily., Disp: 90 tablet, Rfl: 0   Medications ordered in this encounter:  Meds ordered this encounter  Medications   cephALEXin (KEFLEX) 500 MG capsule    Sig: Take 1 capsule (500 mg total) by mouth 2 (two) times daily for 7 days.    Dispense:  14 capsule    Refill:  0    Supervising Provider:   Corine Dice [[id number]]     *If you need refills on other medications prior to your next appointment, please contact your pharmacy*  Follow-Up: Call back or seek an in-person evaluation if the symptoms worsen or if the condition fails to improve as anticipated.  [facility] [phone]  Other Instructions  -no other  red flags for stone or kidney infection -increase fluids -complete medication as discussed  Urinary Tract Infection, Female A urinary tract infection (UTI) is an infection in your urinary tract. The urinary tract is made up of organs that make, store, and get rid of pee (urine) in your body. These organs include: The kidneys. The ureters. The bladder. The urethra. What are the causes? Most UTIs are caused by germs called bacteria. They may be in or near your genitals. These germs grow and cause swelling in your urinary tract. What increases the risk? You're more likely to get a UTI if: You're a female. The urethra is shorter in females than in males. You have a soft tube called a catheter that drains your pee. You can't control when you pee or poop. You have trouble peeing because of: A kidney stone. A urinary blockage. A nerve condition that affects your bladder. Not getting enough to drink. You're sexually active. You use a birth control inside your vagina, like spermicide. You're pregnant. You have low levels of the hormone estrogen in your body. You're an older adult. You're also more likely to get a UTI if you have other health problems. These may include: Diabetes. A weak immune system. Your immune system is your body's defense system. Sickle cell disease. Injury of the spine. What are the signs or symptoms? Symptoms may include: Needing to pee right away. Peeing small amounts often.  Pain or burning when you pee. Blood in your pee. Pee that smells bad or odd. Pain in your belly or lower back. You may also: Feel confused. This may be the first symptom in older adults. Vomit. Not feel hungry. Feel tired or easily annoyed. Have a fever or chills. How is this diagnosed? A UTI is diagnosed based on your medical history and an exam. You may also have other tests. These may include: Pee tests. Blood tests. Tests for sexually transmitted infections (STIs). If you've  had more than one UTI, you may need to have imaging studies done to find out why you keep getting them. How is this treated? A UTI can be treated by: Taking antibiotics or other medicines. Drinking enough fluid to keep your pee pale yellow. In rare cases, a UTI can cause a very bad condition called sepsis. Sepsis may be treated in the hospital. Follow these instructions at home: Medicines Take your medicines only as told by your health care provider. If you were given antibiotics, take them as told by your provider. Do not stop taking them even if you start to feel better. General instructions Make sure you: Pee often and fully. Do not hold your pee for a long time. Wipe from front to back after you pee or poop. Use each tissue only once when you wipe. Pee after you have sex. Do not douche or use sprays or powders in your genital area. Contact a health care provider if: Your symptoms don't get better after 1-2 days of taking antibiotics. Your symptoms go away and then come back. You have a fever or chills. You vomit or feel like you may vomit. Get help right away if: You have very bad pain in your back or lower belly. You faint. This information is not intended to replace advice given to you by your health care provider. Make sure you discuss any questions you have with your health care provider. Document Revised: 01/06/2023 Document Reviewed: 09/03/2022 Elsevier Patient Education  2024 Elsevier Inc.     If you have been instructed to have an in-person evaluation today at a local Urgent Care facility, please use the link below. It will take you to a list of all of our available [facility], including address, phone number and hours of operation. Please do not delay care.  [facility]  If you or a family member do not have a primary care provider, use the link below to schedule a visit and establish care. When you choose a [facility] primary care physician  or advanced practice provider, you gain a long-term partner in health. Find a Primary Care Provider  Learn more about [facility]'s in-office and virtual care options:  - Get Care Now

## 2023-10-26 NOTE — Progress Notes (Signed)
 Virtual Visit Consent   Donna Ryan, you are scheduled for a virtual visit with a [facility] provider today. Just as with appointments in the office, your consent must be obtained to participate. Your consent will be active for this visit and any virtual visit you may have with one of our providers in the next 365 days. If you have a MyChart account, a copy of this consent can be sent to you electronically.  As this is a virtual visit, video technology does not allow for your provider to perform a traditional examination. This may limit your provider's ability to fully assess your condition. If your provider identifies any concerns that need to be evaluated in person or the need to arrange testing (such as labs, EKG, etc.), we will make arrangements to do so. Although advances in technology are sophisticated, we cannot ensure that it will always work on either your end or our end. If the connection with a video visit is poor, the visit may have to be switched to a telephone visit. With either a video or telephone visit, we are not always able to ensure that we have a secure connection.  By engaging in this virtual visit, you consent to the provision of healthcare and authorize for your insurance to be billed (if applicable) for the services provided during this visit. Depending on your insurance coverage, you may receive a charge related to this service.  I need to obtain your verbal consent now. Are you willing to proceed with your visit today? [name] has provided verbal consent on 10/26/2023 for a virtual visit (video or telephone). Donna Pion, NP  Date: 10/26/2023 1:33 PM   Virtual Visit via Video Note   I, Donna Ryan, connected with  Donna Ryan  ([id number], March 02, 1986) on 10/26/23 at  1:30 PM EDT by a video-enabled telemedicine application and verified that I am speaking with the correct person using two identifiers.  Location: Patient: Virtual Visit Location Patient:  Home Provider: Virtual Visit Location Provider: Home Office   I discussed the limitations of evaluation and management by telemedicine and the availability of in person appointments. The patient expressed understanding and agreed to proceed.    History of Present Illness: Donna Ryan is a 38 y.o. who identifies as a female who was assigned female at birth, and is being seen today for UTI .  HPI: Urinary Tract Infection  This is a new (unsure if it was that long- or not) problem. The current episode started more than 1 month ago. The problem occurs every urination. The problem has been gradually worsening. The quality of the pain is described as burning. The pain is mild. There has been no fever. She is Sexually active. There is No history of pyelonephritis. Associated symptoms include frequency, hesitancy, nausea and urgency. Pertinent negatives include no chills, discharge, flank pain, hematuria, possible pregnancy, sweats or vomiting. Treatments tried: AZO. The treatment provided no relief. There is no history of kidney stones, recurrent UTIs or a single kidney.    Problems: There are no active problems to display for this patient.   Allergies: No Known Allergies Medications:  Current Outpatient Medications:    famotidine  (PEPCID ) 20 MG tablet, Take 1 tablet (20 mg total) by mouth 2 (two) times daily., Disp: 30 tablet, Rfl: 2   methocarbamol  (ROBAXIN ) 500 MG tablet, Take 1 tablet (500 mg total) by mouth every 8 (eight) hours as needed for muscle spasms., Disp: 8 tablet, Rfl: 0  pantoprazole  (PROTONIX ) 40 MG tablet, Take 1 tablet (40 mg total) by mouth daily., Disp: 30 tablet, Rfl: 1   Plecanatide (TRULANCE) 3 MG TABS, Take 1 tablet (3 mg total) by mouth daily., Disp: 90 tablet, Rfl: 0  Observations/Objective: Patient is well-developed, well-nourished in no acute distress.  Resting comfortably  at home.  Head is normocephalic, atraumatic.  No labored breathing.  Speech is clear and  coherent with logical content.  Patient is alert and oriented at baseline.    Assessment and Plan:  1. Suspected UTI (Primary)  - cephALEXin (KEFLEX) 500 MG capsule; Take 1 capsule (500 mg total) by mouth 2 (two) times daily for 7 days.  Dispense: 14 capsule; Refill: 0  -no other red flags for stone or kidney infection -increase fluids -complete medication as discussed -prevention discussed and on AVS  Reviewed side effects, risks and benefits of medication.    Patient acknowledged agreement and understanding of the plan.   Past Medical, Surgical, Social History, Allergies, and Medications have been Reviewed.       Follow Up Instructions: I discussed the assessment and treatment plan with the patient. The patient was provided an opportunity to ask questions and all were answered. The patient agreed with the plan and demonstrated an understanding of the instructions.  A copy of instructions were sent to the patient via MyChart unless otherwise noted below.    The patient was advised to call back or seek an in-person evaluation if the symptoms worsen or if the condition fails to improve as anticipated.    Donna Pion, NP

## 2023-10-26 NOTE — Telephone Encounter (Signed)
 Pharmacy Patient Advocate Encounter   Received notification from CoverMyMeds that prior authorization for Trulance 3MG  tablets is required/requested.   Insurance verification completed.   The patient is insured through [employer] .   Per test claim: PA required; PA submitted to above mentioned insurance via CoverMyMeds Key/confirmation #/EOC [id number] Status is pending

## 2023-10-27 ENCOUNTER — Other Ambulatory Visit: Payer: Self-pay | Admitting: Gastroenterology

## 2023-10-27 ENCOUNTER — Other Ambulatory Visit (HOSPITAL_COMMUNITY): Payer: Self-pay

## 2023-10-27 DIAGNOSIS — K5904 Chronic idiopathic constipation: Secondary | ICD-10-CM

## 2023-10-27 MED ORDER — LUBIPROSTONE 8 MCG PO CAPS
8.0000 ug | ORAL_CAPSULE | Freq: Two times a day (BID) | ORAL | 2 refills | Status: DC
  Filled 2023-10-27: qty 60, 30d supply, fill #0

## 2023-10-27 NOTE — Progress Notes (Signed)
 Trulance not covered- amitiza generic is- sent RX for [facility]

## 2023-10-27 NOTE — Telephone Encounter (Signed)
 Pharmacy Patient Advocate Encounter  Received notification from [employer] that Prior Authorization for Trulance 3MG  tablets has been DENIED.  Full denial letter will be uploaded to the media tab. See denial reason below.  Medications without Drug or Class Specific Criteria has not been met. To meet our criteria:  The durg must be approved for your condition The dose must be approved for your condition You must first try two preferred drugs used to treat your condition or there is a reason they cannot be used We cannot approve the request because you have tried Linzess, but you need to try one more, Lubiprostone  PA #/Case ID/Reference #: [id number]

## 2024-02-16 ENCOUNTER — Telehealth: Payer: Self-pay

## 2024-02-16 ENCOUNTER — Telehealth: Payer: Self-pay | Admitting: Gastroenterology

## 2024-02-16 ENCOUNTER — Other Ambulatory Visit: Payer: Self-pay

## 2024-02-16 ENCOUNTER — Other Ambulatory Visit (HOSPITAL_COMMUNITY): Payer: Self-pay

## 2024-02-16 MED ORDER — TRULANCE 3 MG PO TABS
3.0000 mg | ORAL_TABLET | Freq: Every day | ORAL | 0 refills | Status: AC
  Filled 2024-02-16 – 2024-02-17 (×2): qty 30, 30d supply, fill #0
  Filled 2024-04-02: qty 30, 30d supply, fill #1
  Filled 2024-04-27: qty 30, 30d supply, fill #2

## 2024-02-16 NOTE — Telephone Encounter (Signed)
 Pharmacy Patient Advocate Encounter   Received notification from Pt Calls Messages that prior authorization for Trulance  3MG  tablets is required/requested.   Insurance verification completed.   The patient is insured through [facility] MEDICAID .   Per test claim: PA required; PA submitted to above mentioned insurance via Latent Key/confirmation #/EOC [id number] Status is pending

## 2024-02-16 NOTE — Telephone Encounter (Signed)
 Patient is requesting Trulance  prescription to be sent in. It was previously denied since she needed to try Amitiza  before it would be approved. She's been taking Amitiza  8 mcg BID with no changes in constipation. She would like this called into [facility] pharmacy.

## 2024-02-16 NOTE — Telephone Encounter (Signed)
 Inbound call from patient stating that she has finished taking the Amitiza  and it did not help her. She states that Deanna advised that if it did not work that she would send in Trulance . Patient is requesting  a call to discuss if its possible to get medication. Please advise.

## 2024-02-16 NOTE — Telephone Encounter (Signed)
 PA request has been Submitted. New Encounter has been or will be created for follow up. For additional info see Pharmacy Prior Auth telephone encounter from 02/16/2024.

## 2024-02-16 NOTE — Telephone Encounter (Signed)
Prescription sent to pharmacy. My chart message sent

## 2024-02-17 ENCOUNTER — Other Ambulatory Visit (HOSPITAL_COMMUNITY): Payer: Self-pay

## 2024-02-17 NOTE — Telephone Encounter (Signed)
 Pharmacy Patient Advocate Encounter  Received notification from [facility] MEDICAID that Prior Authorization for Trulance  3MG  tablets has been APPROVED from 02-16-2024 to 02-15-2025   PA #/Case ID/Reference #: [id number]

## 2024-06-05 ENCOUNTER — Other Ambulatory Visit (HOSPITAL_COMMUNITY): Payer: Self-pay

## 2024-06-05 ENCOUNTER — Telehealth: Payer: Self-pay | Admitting: Internal Medicine

## 2024-06-05 DIAGNOSIS — K219 Gastro-esophageal reflux disease without esophagitis: Secondary | ICD-10-CM

## 2024-06-05 MED ORDER — TRULANCE 3 MG PO TABS
1.0000 | ORAL_TABLET | Freq: Every day | ORAL | 5 refills | Status: AC
  Filled 2024-06-05: qty 30, 30d supply, fill #0
  Filled 2024-07-16: qty 30, 30d supply, fill #1

## 2024-06-05 MED ORDER — PANTOPRAZOLE SODIUM 40 MG PO TBEC
40.0000 mg | DELAYED_RELEASE_TABLET | Freq: Every day | ORAL | 1 refills | Status: AC
  Filled 2024-06-05: qty 30, 30d supply, fill #0
  Filled 2024-07-16 (×2): qty 30, 30d supply, fill #1

## 2024-06-05 NOTE — Telephone Encounter (Signed)
 Inbound call from patient requesting refill for Protonix  and Trulance . Please advise.

## 2024-06-05 NOTE — Telephone Encounter (Signed)
 Prescriptions sent to patient's pharmacy.

## 2024-07-16 ENCOUNTER — Other Ambulatory Visit (HOSPITAL_COMMUNITY): Payer: Self-pay

## 2024-07-16 ENCOUNTER — Other Ambulatory Visit: Payer: Self-pay
